# Patient Record
Sex: Male | Born: 1975 | Race: White | Hispanic: No | Marital: Married | Smoking: Current every day smoker
Health system: Southern US, Community
[De-identification: ages and names within clinical notes are randomized; demographics above are authoritative.]

## PROBLEM LIST (undated history)

## (undated) DIAGNOSIS — J45909 Unspecified asthma, uncomplicated: Secondary | ICD-10-CM

## (undated) DIAGNOSIS — Z22322 Carrier or suspected carrier of Methicillin resistant Staphylococcus aureus: Secondary | ICD-10-CM

---

## 2006-10-18 ENCOUNTER — Inpatient Hospital Stay (HOSPITAL_COMMUNITY): Admission: EM | Admit: 2006-10-18 | Discharge: 2006-10-23 | Payer: Self-pay | Admitting: Emergency Medicine

## 2009-09-13 ENCOUNTER — Emergency Department (HOSPITAL_COMMUNITY): Admission: EM | Admit: 2009-09-13 | Discharge: 2009-09-13 | Payer: Self-pay | Admitting: Emergency Medicine

## 2010-07-05 NOTE — Discharge Summary (Signed)
NAMETRINTON, PREWITT                  ACCOUNT NO.:  [id number]   MEDICAL RECORD NO.:  [id number]          PATIENT TYPE:  INP   LOCATION:  2031                         FACILITY:  [facility]   PHYSICIAN:  Mobolaji B. Bakare, M.D.DATE OF BIRTH:  25-Apr-1975   DATE OF ADMISSION:  10/18/2006  DATE OF DISCHARGE:  10/22/2006                               DISCHARGE SUMMARY   PRIMARY CARE PHYSICIAN:  Evelena Peat, M.D. at [facility]   FINAL DIAGNOSES:  1. Left facial cellulitis and methicillin-resistant Staphylococcus      aureus abscess.  2. Asthma exacerbation.  3. Tobacco abuse.  4. Obesity.   CONSULT:  ENT consult provided by Onalee Hua L. Annalee Genta, M.D.   PROCEDURE:  1. Maxillofacial CT shows extensive left-sided cellulitis without      evidence of abscess, periorbital soft tissue swelling without retro-      orbital extension, mild left-sided adenopathy likely reactive  2. Incision and drainage in the operating room done on October 19, 2006.   BRIEF HISTORY:  Please refer to the admission H&P for full details.   HOSPITAL COURSE:  Left facial cellulitis and MRSA abscess.  Mr. Matlock  presented with 4 days history of progressive left facial swelling,  fever, pain.  Clinically he was noted to have left facial cellulitis  with possibility of an abscess.  However CT scan of the maxillofacial  did not confirm an abscess.  The patient was admitted and started on IV  vancomycin for MRSA infection.  On October 19, 2006 it was noted that the  swelling had increased in size with central area of fluctuance.  He was  subsequently taken to the OR for incision and drainage and cultures were  sent.  Cultures taken from the emergency room and intraoperatively  reported MRSA sensitive to Bactrim, doxycycline and vancomycin.  The  patient completed 4-day course of IV vancomycin.  He has been switched  to p.o. doxycycline on discharge which he will complete for 10 days.  It  is also  noted that the patient was on Bactrim for 24 hours prior to  hospitalization.   Asthma exacerbation.  The patient does have history of asthma, but he  has not been compliant with using his nebulizer.  He was in mild  exacerbation during the course of hospitalization.  He was started on  regular nebulization.  He improved significantly and this has been  switched to p.r.n.  From the patient's history it appears that he has  chronic persistent asthma.  He was started on Flovent during the course  of hospitalization, but the patient refused to continue using any form  of inhaler on a regular basis.  However, he is willing to use albuterol  p.r.n.  I advised him to discuss this with his primary care physician.   The patient has also been counseled on tobacco cessation.  He is willing  to quit.   DISCHARGE MEDICATIONS:  Doxycycline 100 mg b.i.d. for 10 days, Percocet  5/32 one to two p.o. q.4h. p.r.n., albuterol inhaler 2 puffs  q.2h.  p.r.n.   Wound care with normal saline and dry gauze two times a day.  Follow-up  with Evelena Peat, M.D./[facility] in a week.      Mobolaji B. Corky Downs, M.D.  Electronically Signed     MBB/MEDQ  D:  10/22/2006  T:  10/22/2006  Job:  [id number]   cc:   Onalee Hua L. Annalee Genta, M.D.  Evelena Peat, M.D.

## 2010-07-05 NOTE — Discharge Summary (Signed)
NAMEIMANOL, BIHL                  ACCOUNT NO.:  [id number]   MEDICAL RECORD NO.:  [id number]          PATIENT TYPE:  INP   LOCATION:  5742                         FACILITY:  [facility]   PHYSICIAN:  Mobolaji B. Bakare, M.D.DATE OF BIRTH:  1975-03-04   DATE OF ADMISSION:  10/18/2006  DATE OF DISCHARGE:  10/23/2006                               DISCHARGE SUMMARY   ADDENDUM:   PRIMARY CARE PHYSICIAN:  Evelena Peat, M.D, [facility].   This is an addendum to earlier discharge summary done by Dr. Corky Downs.  The patient was initially scheduled for discharge on October 22, 2006.  Upon writing the discharge order, a call was received from microbiology  stating that he has gram-positive cocci growing on blood culture done on  October 18, 2006, one out of two sets.  The patient has MRSA abscess from  left facial specimen.  Discharge was therefore delayed to observe for  another 24 hours pending final outcome of the blood culture.  Two more  sets of blood cultures were drawn on October 22, 2006.   The patient has remained afebrile.  He has significant improvement in  the left facial cellulitis.  The final identification of these gram-  positive cocci is not yet available.  I have discussed personally with  microbiology department, and it is of their opinion that this might be a  contaminant but final identification is still pending, and this may be  available in the next 48 hours.  The patient does very well and he got  to go home.  He will be discharged home and follow up with Dr. Caryl Never  in 3 days.   The patient demonstrated some degree of anxiety during this last 24-48  hours.  He does smoke cigarettes and he was on nicotine patch during the  course of hospitalization.  He states that he is sometimes anxious.  I  have prescribed Xanax 0.25 mg  (total of 15 tablets) one to two tablets  q.4-6h. p.r.n. for anxiety, and he would further discuss his symptoms  with Dr.  Caryl Never in the outpatient setting should he require treatment  for generalized anxiety.   RECOMMENDATIONS:  Follow up for report of one set of blood cultures done  on October 18, 2006, and two sets of blood cultures done on October 22, 2006.      Mobolaji B. Corky Downs, M.D.  Electronically Signed     MBB/MEDQ  D:  10/23/2006  T:  10/23/2006  Job:  [id number]   cc:   Evelena Peat, M.D.

## 2010-07-05 NOTE — Op Note (Signed)
Matthew Mendoza, Matthew Mendoza                  ACCOUNT NO.:  [id number]   MEDICAL RECORD NO.:  [id number]          PATIENT TYPE:  INP   LOCATION:  5736                         FACILITY:  [facility]   PHYSICIAN:  Kinnie Scales. Annalee Genta, M.D.DATE OF BIRTH:  08-03-1975   DATE OF PROCEDURE:  10/19/2006  DATE OF DISCHARGE:                               OPERATIVE REPORT   PREOPERATIVE DIAGNOSIS:  Left facial abscess.   POSTOPERATIVE DIAGNOSIS:  Left facial abscess.   INDICATION FOR PROCEDURE:  Left facial abscess.   PROCEDURE:  Incision and drainage, left facial abscess.   SURGEON:  Kinnie Scales. Annalee Genta, M.D.   ANESTHESIA:  General/LMA.   No complications.  Blood loss minimal.  The patient was transferred to  the operating room to the recovery room in stable condition.   BRIEF HISTORY:  Matthew Mendoza is a 35 year old white male with a 5-day  history of progressive swelling and inflammation in the left cheek.  The  patient has had a history of cutaneous skin lesions in the past and felt  that his current infection was likely related to a skin cyst which he  had manipulated.  Over the several days prior to his admission his face  continued to become more erythematous, inflamed and tender and he was  evaluated in the emergency department at [facility] on October 18, 2006.  CT scan at that time showed no evidence of abscess but the  patient had significant soft tissue inflammation and swelling.  He was  admitted to the hospital the [facility] health care team, started on  vancomycin and pain medications.  On the first hospital day he was  reevaluated.  He has had a significant accumulation of fluid and  increasing swelling in the left cheek and given his history, I  recommended incision and drainage of the left cheek under general  anesthesia.  Risks, benefits and possible complications were discussed  with the patient and his wife.  They understood and concurred with our  plan for surgery which is  scheduled on a semi-urgent basis under general  anesthesia at [facility].   SURGICAL PROCEDURE:  The patient was brought to the operating room on  October 19, 2006, and placed in the supine position on the operating  table.  General LMA anesthesia was established without difficulty.  When  the patient was adequately anesthetized, his skin was prepped with  Betadine and he was positioned on the operating table.  The procedure  was begun using a hemostat to spread the tissue at the previous draining  site in the posterior lateral aspect of the left cheek.  The abscess  cavity was followed anteriorly and a significant amount of pus,  approximately 30 mL, was expressed from the wound.  Culture and  sensitivity and Gram stain were obtained.  The wound was flushed with  saline solution and a quarter-inch Penrose drain was placed at the depth  of the incision.  It  was sutured to the skin using a 4-0 Ethilon suture.  The patient's wound  was dressed with 4x4 gauze.  He was awakened from his anesthetic and  transferred from the operating room to the recovery room in stable  condition.  No complications.  Blood loss minimal.           ______________________________  Kinnie Scales. Annalee Genta, M.D.     DLS/MEDQ  D:  16/11/9602  T:  10/20/2006  Job:  [id number]

## 2010-07-05 NOTE — H&P (Signed)
NAMECONSTANTINO, STARACE                  ACCOUNT NO.:  [id number]   MEDICAL RECORD NO.:  [id number]          PATIENT TYPE:  EMS   LOCATION:  MINO                         FACILITY:  [facility]   PHYSICIAN:  Mobolaji B. Bakare, M.D.DATE OF BIRTH:  [birth year]   DATE OF ADMISSION:  10/18/2006  DATE OF DISCHARGE:                              HISTORY & PHYSICAL   CHIEF COMPLAINT:  Painful left facial swelling.   HISTORY OF PRESENTING COMPLAINTS:  Mr. Keng is a very pleasant 35-year-  old Caucasian male who has history of asthmatic bronchitis.  He was in  his usual state of health until four days ago when he noticed a boil on  his left cheek.  He popped it out and expressed some pussy discharge.  Subsequently, he developed excruciating pain with swelling and redness  over the left cheek.  He went to see his primary care doctor yesterday.  He was given a shot of injection (I believe Rocephin) and started on  Septra p.o.  Today, he developed fever, temperature was 100.3.  He has  lost his appetite.  He decided to come to the emergency room as the  swelling was getting bigger.  He underwent maxcillofacial CT scan which  confirmed cellulitis, but no abscess.  He has been seen by Dr.  Hattie Perch from ENT.  The patient has received IV Rocephin here in the  emergency room.  He has been admitted for IV antibiotics.   He has no history of diabetes mellitus or family history of diabetes.  The patient denies chills.   REVIEW OF SYMPTOMS:  He has no toothache, no headaches, no change in his  vision, no nausea or vomiting or diarrhea or abdominal pain.  He denies  dysuria, urgency, increased micturition.  The patient endorses daily   PAST MEDICAL HISTORY:  1. Asthma as a child.  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  3 . 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   PAST SURGICAL HISTORY:  None.   CURRENT MEDICATIONS:  Septra two tablets b.i.d. which was started  yesterday.   ALLERGIES:  No known drug  allergies.   FAMILY HISTORY:  Both parents are alive.  No known medical illness.  Grandmother died from ovarian cancer in her mid 54s.   SOCIAL HISTORY:  The patient is a Tax adviser.  He smokes cigarettes and  has been smoking for about 15 years, one pack per year.  He occasionally  drinks alcohol over the weekend.  He is independent of activities of  daily wheezing, particularly early in the morning with cough.  He  acknowledges that he has not been treated his chronic asthma.   PHYSICAL EXAMINATION:  VITAL SIGNS:  Initially, temperature 98.1, blood  pressure 114/77, pulse 98, respirations 16, O2 saturation 99% on room  air.  GENERAL:  He is awake, alert and oriented to person, place and time.  HEENT:  Normocephalic, atraumatic.  Pupils equal, round and reactive to  light and accommodation.  Extraocular movements intact.  Left facial  swelling, redness and tenderness, it feels like, over the left cheek.  There is no gum swelling.  Oropharynx is normal.  There is tender,  small, cervical lymphadenopathy on the left.  LUNGS:  Bilateral exploratory rhonchi.  CV:  S1, S2 regular.  ABDOMEN:  Nondistended.  Soft, nontender.  Bowel sounds present.  EXTREMITIES:  No pedal edema.  Calf tenderness.  Dorsalis pedis pulses  2+ bilaterally.  CNS:  No focal neurological deficits.  SKIN:  No rash or petechiae.   LABORATORY DATA:  Initially, white cell count 20.6, hemoglobin 16.5,  hematocrit 48.8, platelets 289.  Differentials:  Absolute neutrophil  count is elevated 18.2, absolute monocytes 1.1.  Sodium 134, potassium  4.2, chloride 104, BUN 12, glucose 117, bicarbonate 24, pH 7.41.   CT scan of the face showed extensive left sided cellulitis without  evidence of abscess, __________ swelling with __________  extension,  mild left sided adenopathy is likely reactive.   ASSESSMENT/PLAN:  Mr .Kimberlin is a 35 year old Caucasian male presenting  with left facial swelling, pain and redness compatible  with cellulitis.  He has leukocytosis of 20,000.  Maxillofacial CT scan showed on abscess.  He will be admitted for IV antibiotics.   1. Left facial cellulitis.  Will start vancomycin to be dosed by      pharmacy.  Will give Tylenol p.r.n. for pain and Vicodin p.r.n. for      severe pain.  Blood cultures will be obtained.  Will check      hemoglobin A1C to assess no underlying diabetes mellitus.  2. Asthma.  I think he has chronic persistent asthma from the history.      I will start him on Flovent 88 mcg b.i.d., albuterol inhaler t.i.d.      and p.r.n.  I have encouraged him to quit smoking.  He is      interested in quitting.  3. Tobacco abuse.  Will give nicotine patch and offer tobacco      cessation counseling.  4. Obesity . the patient's weigh is 225 pounds.  Will offer weight      loss counseling.      Mobolaji B. Corky Downs, M.D.  Electronically Signed     MBB/MEDQ  D:  10/18/2006  T:  10/18/2006  Job:  [id number]   cc:   Onalee Hua L. Annalee Genta, M.D.  Evelena Peat, M.D.

## 2010-08-31 ENCOUNTER — Inpatient Hospital Stay (INDEPENDENT_AMBULATORY_CARE_PROVIDER_SITE_OTHER)
Admission: RE | Admit: 2010-08-31 | Discharge: 2010-08-31 | Disposition: A | Discharge status: Home / Self Care | Payer: Self-pay | Source: Ambulatory Visit | Attending: Family Medicine | Admitting: Family Medicine

## 2010-08-31 DIAGNOSIS — L0201 Cutaneous abscess of face: Secondary | ICD-10-CM

## 2010-08-31 DIAGNOSIS — L03211 Cellulitis of face: Secondary | ICD-10-CM

## 2010-09-01 ENCOUNTER — Inpatient Hospital Stay (INDEPENDENT_AMBULATORY_CARE_PROVIDER_SITE_OTHER)
Admission: RE | Admit: 2010-09-01 | Discharge: 2010-09-01 | Disposition: A | Discharge status: Home / Self Care | Payer: Self-pay | Source: Ambulatory Visit | Attending: Family Medicine | Admitting: Family Medicine

## 2010-09-01 DIAGNOSIS — L0201 Cutaneous abscess of face: Secondary | ICD-10-CM

## 2010-09-03 ENCOUNTER — Inpatient Hospital Stay (INDEPENDENT_AMBULATORY_CARE_PROVIDER_SITE_OTHER)
Admission: RE | Admit: 2010-09-03 | Discharge: 2010-09-03 | Disposition: A | Discharge status: Home / Self Care | Payer: Self-pay | Source: Ambulatory Visit | Attending: Family Medicine | Admitting: Family Medicine

## 2010-09-03 DIAGNOSIS — L0201 Cutaneous abscess of face: Secondary | ICD-10-CM

## 2010-09-05 LAB — CULTURE, ROUTINE-ABSCESS

## 2010-12-02 LAB — POCT I-STAT CREATININE: Operator id: 285491

## 2010-12-02 LAB — DIFFERENTIAL
Basophils Absolute: 0
Basophils Absolute: 0.1
Basophils Absolute: 0.1
Basophils Relative: 0
Basophils Relative: 1
Eosinophils Absolute: 0.2
Eosinophils Relative: 0
Lymphocytes Relative: 26
Lymphocytes Relative: 6 — ABNORMAL LOW
Lymphs Abs: 1.3
Monocytes Relative: 5
Monocytes Relative: 7
Neutro Abs: 5.5
Neutrophils Relative %: 61
Neutrophils Relative %: 83 — ABNORMAL HIGH
Neutrophils Relative %: 88 — ABNORMAL HIGH

## 2010-12-02 LAB — ANAEROBIC CULTURE

## 2010-12-02 LAB — CULTURE, BLOOD (ROUTINE X 2)
Culture: NO GROWTH
Culture: NO GROWTH
Culture: NO GROWTH

## 2010-12-02 LAB — I-STAT 8, (EC8 V) (CONVERTED LAB)
Acid-base deficit: 1
BUN: 12
Bicarbonate: 23.5
Chloride: 104
HCT: 53 — ABNORMAL HIGH
Hemoglobin: 18 — ABNORMAL HIGH
Operator id: 285491
Sodium: 134 — ABNORMAL LOW
pCO2, Ven: 37.1 — ABNORMAL LOW

## 2010-12-02 LAB — CBC
HCT: 48.8
MCHC: 33.8
MCHC: 34.4
MCV: 83.1
Platelets: 268
Platelets: 289
RBC: 4.53
RBC: 4.75
RDW: 13.2
RDW: 13.5
WBC: 20.6 — ABNORMAL HIGH

## 2010-12-02 LAB — CULTURE, ROUTINE-ABSCESS

## 2010-12-02 LAB — GRAM STAIN

## 2010-12-02 LAB — CARDIAC PANEL(CRET KIN+CKTOT+MB+TROPI)
Relative Index: INVALID
Troponin I: 0.02

## 2011-04-28 ENCOUNTER — Emergency Department (INDEPENDENT_AMBULATORY_CARE_PROVIDER_SITE_OTHER)
Admission: EM | Admit: 2011-04-28 | Discharge: 2011-04-28 | Disposition: A | Discharge status: Home / Self Care | Payer: Self-pay | Source: Home / Self Care | Attending: Emergency Medicine | Admitting: Emergency Medicine

## 2011-04-28 ENCOUNTER — Encounter (HOSPITAL_COMMUNITY): Payer: Self-pay | Admitting: *Deleted

## 2011-04-28 DIAGNOSIS — A4902 Methicillin resistant Staphylococcus aureus infection, unspecified site: Secondary | ICD-10-CM

## 2011-04-28 HISTORY — DX: Carrier or suspected carrier of methicillin resistant Staphylococcus aureus: Z22.322

## 2011-04-28 MED ORDER — MUPIROCIN 2 % EX OINT: TOPICAL_OINTMENT | Freq: Three times a day (TID) | CUTANEOUS | Status: AC

## 2011-04-28 MED ORDER — DOXYCYCLINE HYCLATE 100 MG PO CAPS: 100.0000 mg | ORAL_CAPSULE | Freq: Two times a day (BID) | ORAL | Status: AC

## 2011-04-28 NOTE — ED Notes (Signed)
Pt  Has  A  History  Of  mrsa     He  Reports  sev  Days  Ago  He  Noticed a  Red  Draining  Lesion on l  Side  Of  Face   He  Had some  Doxy  On hand  And  Took one  Today  -    He  Is  requesting  anti biotic RX

## 2011-04-28 NOTE — Discharge Instructions (Signed)
Return here or follow up with your doctor in 2 days for a wound check. Return sooner if you get worse, have a fever >100.4, or any other concerns. Take the medication as written. Take 1 gram of tylenol with the motrin up to 4 times a day as needed for pain and fever. This is an effective combination for pain. Return if you get worse, have a persistent fever >100.4, or for any concerns.   Go to www.goodrx.com to look up your medications. This will give you a list of where you can find your prescriptions at the most affordable prices.   RESOURCE GUIDE  Insufficient Money for Medicine Contact [employer]:  call "211" or [facility] [phone].  No Primary Care Doctor Call [facility]  [phone] Other agencies that provide inexpensive medical care    [facility]  [phone]    [facility]  [phone]    [facility]  [phone]    [facility]  [phone] [address] [place] [address]    [facility]  [phone]    [facility]  [phone] [facility] [phone]   [address] [place] [address] [place], [place] [address]  [facility] Resources  [facility]     [employer]                          [facility] [address] [place]                       [address]      [address] [place] [address]   [phone] (After Hours)

## 2011-04-28 NOTE — ED Provider Notes (Signed)
History     CSN: [id number]  Arrival date & time 04/28/11  1053   First MD Initiated Contact with Patient 04/28/11 1248      Chief Complaint  Patient presents with  . Recurrent Skin Infections    (Consider location/radiation/quality/duration/timing/severity/associated sxs/prior treatment) HPI Comments: Patient with painful, erythematous area on his left cheek starting several days ago. Patient states it started off as a small "pimple", which has become gradually larger. Reports spontaneous purulent drainage starting yesterday. Has been applying warm compresses, tumeric with some improvement. Started taking a leftover prescription of doxycycline, but is requesting a refill of his doxycycline, as he does not have enough to complete full course. No nausea, vomiting, headaches, fevers. No trauma to the area. Patient has had multiple MRSA infections in this area before. States these usually do not respond to Bactrim.  ROS as noted in HPI. All other ROS negative.   Patient is a 36 y.o. male presenting with rash. The history is provided by the patient. No language interpreter was used.  Rash     Past Medical History  Diagnosis Date  . MRSA carrier     History reviewed. No pertinent past surgical history.  History reviewed. No pertinent family history.  History  Substance Use Topics  . Smoking status: Current Everyday Smoker  . Smokeless tobacco: Not on file  . Alcohol Use: No      Review of Systems  Skin: Positive for rash.    Allergies  Review of patient's allergies indicates no known allergies.  Home Medications   Current Outpatient Rx  Name Route Sig Dispense Refill  . DOXYCYCLINE HYCLATE 100 MG PO CAPS Oral Take 1 capsule (100 mg total) by mouth 2 (two) times daily. 20 capsule 0  . MUPIROCIN 2 % EX OINT Topical Apply topically 3 (three) times daily. Apply after warm soak for 10 minutes 22 g 0    BP 101/52  Pulse 64  Temp(Src) 98.4 F (36.9 C) (Oral)  Resp 16   SpO2 99%  Physical Exam  Nursing note and vitals reviewed. Constitutional: He is oriented to person, place, and time. He appears well-developed and well-nourished.  HENT:  Head: Normocephalic and atraumatic.         2 x 2.5 cm tender erythema, induration on left cheek. Expressible purulent drainage. No other lesions.  Eyes: Conjunctivae and EOM are normal.  Neck: Normal range of motion.  Cardiovascular: Normal rate.   Pulmonary/Chest: Effort normal. No respiratory distress.  Abdominal: He exhibits no distension.  Musculoskeletal: Normal range of motion.  Lymphadenopathy:    He has no cervical adenopathy.  Neurological: He is alert and oriented to person, place, and time.  Skin: Skin is warm and dry. Rash noted.  Psychiatric: He has a normal mood and affect. His behavior is normal.    ED Course  Procedures (including critical care time)  Labs Reviewed - No data to display No results found.   1. 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 Will have him continue warm compresses, start him on doxycycline. Will also send home on Bactroban, for future infections. Patient to followup with his primary care physician or a PMD of his choice when necessary.  Domenick Gong, MD 04/28/11 [phone]

## 2011-06-14 ENCOUNTER — Encounter (HOSPITAL_COMMUNITY): Payer: Self-pay | Admitting: *Deleted

## 2011-06-14 ENCOUNTER — Emergency Department (INDEPENDENT_AMBULATORY_CARE_PROVIDER_SITE_OTHER)
Admission: EM | Admit: 2011-06-14 | Discharge: 2011-06-14 | Disposition: A | Discharge status: Home / Self Care | Payer: Self-pay | Source: Home / Self Care | Attending: Emergency Medicine | Admitting: Emergency Medicine

## 2011-06-14 DIAGNOSIS — L0292 Furuncle, unspecified: Secondary | ICD-10-CM

## 2011-06-14 DIAGNOSIS — F411 Generalized anxiety disorder: Secondary | ICD-10-CM

## 2011-06-14 DIAGNOSIS — F419 Anxiety disorder, unspecified: Secondary | ICD-10-CM

## 2011-06-14 MED ORDER — DOXYCYCLINE HYCLATE 100 MG PO CAPS: 100.0000 mg | ORAL_CAPSULE | Freq: Two times a day (BID) | ORAL | Status: AC

## 2011-06-14 MED ORDER — TRAMADOL HCL 50 MG PO TABS: 50.0000 mg | ORAL_TABLET | Freq: Four times a day (QID) | ORAL | Status: AC | PRN

## 2011-06-14 MED ORDER — BUSPIRONE HCL 10 MG PO TABS: 10.0000 mg | ORAL_TABLET | Freq: Three times a day (TID) | ORAL | Status: AC

## 2011-06-14 NOTE — ED Notes (Signed)
Pt with abcess posterior neck onset x 3 days pt with history abcess MRSA - pt wants culture done - in past had been treatable with doxycycline pt wants to be sure correct antibiotic prescribed

## 2011-06-14 NOTE — ED Notes (Signed)
Pt instructions reviewed x 2 - dr Ladon Applebaum talked with pt second time - now ready for discharge

## 2011-06-14 NOTE — ED Provider Notes (Signed)
History     CSN: [id number]  Arrival date & time 06/14/11  9147   First MD Initiated Contact with Patient 06/14/11 1024      Chief Complaint  Patient presents with  . Abscess    (Consider location/radiation/quality/duration/timing/severity/associated sxs/prior treatment) HPI Comments: I continue to have soft tissue infections frequently. Came in today as an last 3-4 days have had what it feels like a abscess on my neck. Have been using this silver cream and keeping the area close suffered drain on itself, it's very tender and is triggering headaches and soreness all over my neck. Have seen some drainage but still tender. Have been doing some research in breathing and I'm afraid if I continue to take doxycycline could make things more complicated in the future for me as I continue to have this soft tissue infections.  Will like to have this infection culture this time as I can get more information for future antibiotic choices and options. Has several like to reduce the frequency and usage of doxycycline.    Upon discharge was informed by nurse staff the patient had other questions.  1 continue help whenever something for pain as my neck is hurting? 2 could do prescribe me something for my nerves I have used Xanax in the past, this is giving me a lot of anxiety and nerves?  Patient is a 36 y.o. male presenting with abscess. The history is provided by the patient.  Abscess  This is a new problem. The onset was gradual. The problem occurs frequently. The abscess is present on the neck. The abscess is characterized by redness, draining and swelling. Pertinent negatives include no anorexia and no fever.    Past Medical History  Diagnosis Date  . MRSA carrier     History reviewed. No pertinent past surgical history.  History reviewed. No pertinent family history.  History  Substance Use Topics  . Smoking status: Current Everyday Smoker  . Smokeless tobacco: Not on file  .  Alcohol Use: No      Review of Systems  Constitutional: Negative for fever, activity change and appetite change.  Gastrointestinal: Negative for anorexia.  Skin: Positive for rash.  Neurological: Positive for headaches.    Allergies  Review of patient's allergies indicates no known allergies.  Home Medications   Current Outpatient Rx  Name Route Sig Dispense Refill  . BUSPIRONE HCL 10 MG PO TABS Oral Take 1 tablet (10 mg total) by mouth 3 (three) times daily. 30 tablet 0  . DOXYCYCLINE HYCLATE 100 MG PO CAPS Oral Take 1 capsule (100 mg total) by mouth 2 (two) times daily. 20 capsule 0  . TRAMADOL HCL 50 MG PO TABS Oral Take 1 tablet (50 mg total) by mouth every 6 (six) hours as needed for pain. 15 tablet 0    BP 135/81  Pulse 67  Temp(Src) 98.5 F (36.9 C) (Oral)  Resp 18  SpO2 98%  Physical Exam  Nursing note and vitals reviewed. Constitutional: He is oriented to person, place, and time. He appears well-developed and well-nourished.  Neurological: He is alert and oriented to person, place, and time.  Skin: Skin is warm.     Psychiatric: His speech is normal. Judgment and thought content normal. His mood appears anxious. His affect is not angry and not inappropriate. He is is hyperactive. He is not agitated, not aggressive, not slowed, not withdrawn and not combative. Cognition and memory are normal. He does not exhibit a depressed mood. He is  attentive.    ED Course  INCISION AND DRAINAGE Performed by: Falyn Rubel Authorized by: Jimmie Molly Consent: Verbal consent obtained. Patient understanding: patient states understanding of the procedure being performed Patient identity confirmed: verbally with patient Type: abscess Body area: head/neck Location details: neck Local anesthetic: NaHCO3 (sodium bicarbonate) Patient sedated: no Scalpel size: 15 Drainage: purulent Drainage amount: scant Wound treatment: wound left open Patient tolerance: Patient tolerated the  procedure well with no immediate complications.   (including critical care time)   Labs Reviewed  CULTURE, ROUTINE-ABSCESS   No results found.   1. Carbuncle and furuncle   2. Anxiety       MDM  Recurrent soft tissue infections patient with a drainable carbuncle on right side posterior aspect of neck with some mild tenderness drainage sample obtained and cultured. Patient with several concerns and in addition expressing some generalized anxiety symptoms. Upon usual treatment patient will followup with our behavioral clinic for further treatment and to address his anxiety related symptoms. Patient opted and requests to be treated this time the doxycycline but also wanted culture done for future reference. Small carbuncle was incised in puncture obtaining minimal, of purulent exudate  Jimmie Molly, MD 06/14/11 1227

## 2011-06-14 NOTE — Discharge Instructions (Signed)
Post discharge we discuss several things. Upon mutual agreement you will followup with your behavioral clinic.    Abscess An abscess (boil or furuncle) is an infected area that contains a collection of pus.  SYMPTOMS Signs and symptoms of an abscess include pain, tenderness, redness, or hardness. You may feel a moveable soft area under your skin. An abscess can occur anywhere in the body.  TREATMENT  A surgical cut (incision) may be made over your abscess to drain the pus. Gauze may be packed into the space or a drain may be looped through the abscess cavity (pocket). This provides a drain that will allow the cavity to heal from the inside outwards. The abscess may be painful for a few days, but should feel much better if it was drained.  Your abscess, if seen early, may not have localized and may not have been drained. If not, another appointment may be required if it does not get better on its own or with medications. HOME CARE INSTRUCTIONS   Only take over-the-counter or prescription medicines for pain, discomfort, or fever as directed by your caregiver.   Take your antibiotics as directed if they were prescribed. Finish them even if you start to feel better.   Keep the skin and clothes clean around your abscess.   If the abscess was drained, you will need to use gauze dressing to collect any draining pus. Dressings will typically need to be changed 3 or more times a day.   The infection may spread by skin contact with others. Avoid skin contact as much as possible.   Practice good hygiene. This includes regular hand washing, cover any draining skin lesions, and do not share personal care items.   If you participate in sports, do not share athletic equipment, towels, whirlpools, or personal care items. Shower after every practice or tournament.   If a draining area cannot be adequately covered:   Do not participate in sports.   Children should not participate in day care until the  wound has healed or drainage stops.   If your caregiver has given you a follow-up appointment, it is very important to keep that appointment. Not keeping the appointment could result in a much worse infection, chronic or permanent injury, pain, and disability. If there is any problem keeping the appointment, you must call back to this facility for assistance.  SEEK MEDICAL CARE IF:   You develop increased pain, swelling, redness, drainage, or bleeding in the wound site.   You develop signs of generalized infection including muscle aches, chills, fever, or a general ill feeling.   You have an oral temperature above 102 F (38.9 C).  MAKE SURE YOU:   Understand these instructions.   Will watch your condition.   Will get help right away if you are not doing well or get worse.  Document Released: 11/16/2004 Document Revised: 01/26/2011 Document Reviewed: 09/10/2007 [facility] Patient Information 2012 [place], [place].

## 2011-06-18 LAB — CULTURE, ROUTINE-ABSCESS

## 2011-06-19 ENCOUNTER — Telehealth (HOSPITAL_COMMUNITY): Payer: Self-pay | Admitting: *Deleted

## 2011-06-19 NOTE — ED Notes (Signed)
Pt returned call, MRSA instructions provided.  Pt came in for copy of lab result.  Result provided.

## 2011-06-19 NOTE — ED Notes (Signed)
Wound culture positive for MRSA, pt adequately treated at visit with Doxycycline.  Attempted to contact pt, left message.

## 2012-03-18 ENCOUNTER — Emergency Department (HOSPITAL_BASED_OUTPATIENT_CLINIC_OR_DEPARTMENT_OTHER)
Admission: EM | Admit: 2012-03-18 | Discharge: 2012-03-18 | Disposition: A | Discharge status: Home / Self Care | Payer: Self-pay | Attending: Emergency Medicine | Admitting: Emergency Medicine

## 2012-03-18 ENCOUNTER — Encounter (HOSPITAL_BASED_OUTPATIENT_CLINIC_OR_DEPARTMENT_OTHER): Payer: Self-pay | Admitting: *Deleted

## 2012-03-18 DIAGNOSIS — L03211 Cellulitis of face: Secondary | ICD-10-CM | POA: Insufficient documentation

## 2012-03-18 DIAGNOSIS — F17200 Nicotine dependence, unspecified, uncomplicated: Secondary | ICD-10-CM | POA: Insufficient documentation

## 2012-03-18 DIAGNOSIS — Z8614 Personal history of Methicillin resistant Staphylococcus aureus infection: Secondary | ICD-10-CM | POA: Insufficient documentation

## 2012-03-18 DIAGNOSIS — Z79899 Other long term (current) drug therapy: Secondary | ICD-10-CM | POA: Insufficient documentation

## 2012-03-18 DIAGNOSIS — L0201 Cutaneous abscess of face: Secondary | ICD-10-CM | POA: Insufficient documentation

## 2012-03-18 MED ORDER — SULFAMETHOXAZOLE-TRIMETHOPRIM 800-160 MG PO TABS: 1.0000 | ORAL_TABLET | Freq: Two times a day (BID) | ORAL | Status: DC

## 2012-03-18 NOTE — ED Notes (Signed)
Pt c/o abscess to face x 1 day  HX MRSA

## 2012-03-18 NOTE — ED Provider Notes (Signed)
History     CSN: [id number]  Arrival date & time 03/18/12  2018   First MD Initiated Contact with Patient 03/18/12 2214      Chief Complaint  Patient presents with  . Abscess    (Consider location/radiation/quality/duration/timing/severity/associated sxs/prior treatment) Patient is a 37 y.o. male presenting with abscess. The history is provided by the patient.  Abscess  This is a recurrent problem. The current episode started yesterday. The onset was sudden. The problem occurs continuously. The problem has been gradually worsening. The abscess is present on the face. The problem is moderate. The abscess is characterized by itchiness, redness and painfulness. It is unknown what he was exposed to. The abscess first occurred at home. Pertinent negatives include no fever, no vomiting, no sore throat and no cough. Associated symptoms comments: nausea.    Past Medical History  Diagnosis Date  . MRSA carrier     History reviewed. No pertinent past surgical history.  History reviewed. No pertinent family history.  History  Substance Use Topics  . Smoking status: Current Every Day Smoker -- 1.0 packs/day    Types: Cigarettes  . Smokeless tobacco: Not on file  . Alcohol Use: No      Review of Systems  Constitutional: Negative for fever.  HENT: Negative for sore throat.   Respiratory: Negative for cough.   Gastrointestinal: Negative for vomiting.  All other systems reviewed and are negative.    Allergies  Review of patient's allergies indicates no known allergies.  Home Medications   Current Outpatient Rx  Name  Route  Sig  Dispense  Refill  . BUSPIRONE HCL 10 MG PO TABS   Oral   Take 1 tablet (10 mg total) by mouth 3 (three) times daily.   30 tablet   0     BP 134/70  Pulse 79  Temp 98.4 F (36.9 C) (Oral)  Resp 16  Ht 5\' 9"  (1.753 m)  Wt 225 lb (102.059 kg)  BMI 33.23 kg/m2  SpO2 100%  Physical Exam  Constitutional: He is oriented to person, place, and  time. He appears well-developed and well-nourished. No distress.  HENT:  Head: Atraumatic. Microcephalic:  No airway impingement. No neck swelling. Patient's airway is very patent.    Mouth/Throat: No oropharyngeal exudate.       Submental space is soft. Left submandibular cervical lymphadenopathy.  No airway impingement. No neck swelling. Patient's airway is patent.  Eyes: EOM are normal. Pupils are equal, round, and reactive to light.  Neck: Normal range of motion. Neck supple.  Cardiovascular: Normal rate and regular rhythm.  Exam reveals no friction rub.   No murmur heard. Pulmonary/Chest: Effort normal and breath sounds normal. No respiratory distress. He has no wheezes. He has no rales.  Abdominal: He exhibits no distension. There is no tenderness. There is no rebound.  Musculoskeletal: Normal range of motion. He exhibits no edema.  Neurological: He is alert and oriented to person, place, and time.  Skin: He is not diaphoretic.    ED Course  Procedures (including critical care time)  Labs Reviewed - No data to display No results found.   1. Facial cellulitis       MDM   37 year old male with history of recurrent abscesses presents with a chin rash that began yesterday and has gradually worsened. Mild associated nausea. Vitals are stable here. He has left chin cellulitis. No concern for abscess. It is actively oozing and draining. No focal point of fluctuance that needs I&D.  Culture shows susceptibility to Bactrim. Given Bactrim to go home. Submental space is soft and the patient. Airways intact. He has mild reactive lymphadenopathy there. No concern for Ludwig's angina.  Elwin Mocha, MD 03/19/12 [phone]

## 2012-03-19 NOTE — ED Notes (Signed)
Pt called to requests doxycycline instead of bactrim for abscess; states has hx of mrsa and he knows doxycycline will work for him; prescription for doxycycline 100mg  po bid for 10 days, no refills per dr rancour called [phone] at pt request.

## 2012-03-22 NOTE — ED Provider Notes (Signed)
I saw and evaluated the patient, reviewed the resident's note and I agree with the findings and plan.  Matthew Chick, MD 03/22/12 1120

## 2012-05-16 ENCOUNTER — Encounter (HOSPITAL_COMMUNITY): Payer: Self-pay | Admitting: *Deleted

## 2012-05-16 ENCOUNTER — Emergency Department (INDEPENDENT_AMBULATORY_CARE_PROVIDER_SITE_OTHER)
Admission: EM | Admit: 2012-05-16 | Discharge: 2012-05-16 | Disposition: A | Discharge status: Home / Self Care | Payer: Self-pay | Source: Home / Self Care | Attending: Family Medicine | Admitting: Family Medicine

## 2012-05-16 DIAGNOSIS — L03211 Cellulitis of face: Secondary | ICD-10-CM

## 2012-05-16 DIAGNOSIS — L0201 Cutaneous abscess of face: Secondary | ICD-10-CM

## 2012-05-16 MED ORDER — IBUPROFEN 600 MG PO TABS: 600.0000 mg | ORAL_TABLET | Freq: Three times a day (TID) | ORAL | Status: DC | PRN

## 2012-05-16 MED ORDER — CHLORHEXIDINE GLUCONATE 4 % EX SOLN: 1.0000 "application " | CUTANEOUS | Status: DC

## 2012-05-16 MED ORDER — TRAMADOL HCL 50 MG PO TABS: 50.0000 mg | ORAL_TABLET | Freq: Four times a day (QID) | ORAL | Status: DC | PRN

## 2012-05-16 MED ORDER — DOXYCYCLINE HYCLATE 100 MG PO CAPS: 100.0000 mg | ORAL_CAPSULE | Freq: Two times a day (BID) | ORAL | Status: DC

## 2012-05-16 MED ORDER — MUPIROCIN 2 % EX OINT: TOPICAL_OINTMENT | Freq: Three times a day (TID) | CUTANEOUS | Status: DC

## 2012-05-16 NOTE — ED Provider Notes (Signed)
History     CSN: [id number]  Arrival date & time 05/16/12  1013   First MD Initiated Contact with Patient 05/16/12 1042      Chief Complaint  Patient presents with  . 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 Here complaining of an area of tenderness in the right cheek. Symptoms present for 3 days, started as a pustule but he tried to squeeze with no success now are is swollen and tender. Patient reports he has had a skin infections responding well with doxycycline in the past and wants to have this same antibiotic. He wants also a prescription for Bactroban ointment. Denies fever or chills.   Past Medical History  Diagnosis Date  . MRSA carrier     History reviewed. No pertinent past surgical history.  History reviewed. No pertinent family history.  History  Substance Use Topics  . Smoking status: Current Every Day Smoker -- 1.00 packs/day    Types: Cigarettes  . Smokeless tobacco: Not on file  . Alcohol Use: No      Review of Systems  Constitutional: Negative for fever, chills, appetite change and fatigue.  Gastrointestinal: Negative for nausea.  Skin: Positive for rash.       As per history of present illness  Neurological: Positive for headaches.    Allergies  Review of patient's allergies indicates no known allergies.  Home Medications   Current Outpatient Rx  Name  Route  Sig  Dispense  Refill  . busPIRone (BUSPAR) 10 MG tablet   Oral   Take 1 tablet (10 mg total) by mouth 3 (three) times daily.   30 tablet   0   . Chlorhexidine Gluconate 4 % SOLN   Apply externally   Apply 1 application topically 2 (two) times a week.   1 Bottle   0   . doxycycline (VIBRAMYCIN) 100 MG capsule   Oral   Take 1 capsule (100 mg total) by mouth 2 (two) times daily.   20 capsule   0   . ibuprofen  (ADVIL,MOTRIN) 600 MG tablet   Oral   Take 1 tablet (600 mg total) by mouth every 8 (eight) hours as needed for pain.   20 tablet   0   . mupirocin ointment (BACTROBAN) 2 %   Topical   Apply topically 3 (three) times daily.   22 g   0   . traMADol (ULTRAM) 50 MG tablet   Oral   Take 1 tablet (50 mg total) by mouth every 6 (six) hours as needed for pain.   15 tablet   0     BP 116/71  Pulse 62  Temp(Src) 98.1 F (36.7 C) (Oral)  Resp 16  SpO2 100%  Physical Exam  Nursing note and vitals reviewed. Constitutional: He is oriented to person, place, and time. He appears well-developed and well-nourished. No distress.  HENT:  Head: Normocephalic and atraumatic.  Mouth/Throat: Oropharynx is clear and moist.  Eyes: Conjunctivae and EOM are normal. Right eye exhibits no discharge. Left eye exhibits no discharge.  Neck: Neck supple.  Cardiovascular: Normal heart sounds.   Pulmonary/Chest: Breath sounds normal.  Lymphadenopathy:    He has no cervical adenopathy.  Neurological: He is alert and oriented to person, place, and time.  Skin: He is not diaphoretic.  There is a 2 x 2 centimeter area of mild erythema and induration with a central  excoriation in the right lower cheek close to the right perioral crease. no fluctuation. No spontaneous drainage. No striking erythema. There is a well-healed acne marks and other areas of the face.    ED Course  Procedures (including critical care time)  Labs Reviewed - No data to display No results found.   1. Cellulitis and abscess of face       MDM  No areas of fluctuation. Impress small cellulitis of the face. Treated with doxycycline and mupirocin. Also given a prescription for ibuprofen and tramadol. Also provided information and a prescription for chlorhexidine to use from neck down twice a week. Supportive care and red flags should prompt his return to medical attention discussed with patient and provided in writing.  Specifically patient was asked to go to the emergency department if worsening redness swelling or pain in his face despite following treatment.        Sharin Grave, MD 05/16/12 1501

## 2012-05-16 NOTE — ED Notes (Signed)
Pt reports swelling to right side of face with multiple hx of MRSA infections (to be treated with doxy)

## 2012-07-16 ENCOUNTER — Encounter (HOSPITAL_COMMUNITY): Payer: Self-pay | Admitting: Emergency Medicine

## 2012-07-16 ENCOUNTER — Emergency Department (INDEPENDENT_AMBULATORY_CARE_PROVIDER_SITE_OTHER)
Admission: EM | Admit: 2012-07-16 | Discharge: 2012-07-16 | Disposition: A | Discharge status: Home / Self Care | Payer: Self-pay | Source: Home / Self Care | Attending: Emergency Medicine | Admitting: Emergency Medicine

## 2012-07-16 DIAGNOSIS — L089 Local infection of the skin and subcutaneous tissue, unspecified: Secondary | ICD-10-CM

## 2012-07-16 HISTORY — DX: Unspecified asthma, uncomplicated: J45.909

## 2012-07-16 MED ORDER — CEFTRIAXONE SODIUM 1 G IJ SOLR
1.0000 g | Freq: Once | INTRAMUSCULAR | Status: AC
  Administered 2012-07-16: 1 g via INTRAMUSCULAR

## 2012-07-16 MED ORDER — DOXYCYCLINE HYCLATE 100 MG PO TABS
ORAL_TABLET | ORAL | Status: AC
  Filled 2012-07-16: qty 1

## 2012-07-16 MED ORDER — DOXYCYCLINE HYCLATE 100 MG PO CAPS: 100.0000 mg | ORAL_CAPSULE | Freq: Two times a day (BID) | ORAL | Status: DC

## 2012-07-16 MED ORDER — OXYCODONE-ACETAMINOPHEN 5-325 MG PO TABS: 2.0000 | ORAL_TABLET | ORAL | Status: DC | PRN

## 2012-07-16 MED ORDER — HYDROCODONE-ACETAMINOPHEN 5-325 MG PO TABS: 2.0000 | ORAL_TABLET | ORAL | Status: DC | PRN

## 2012-07-16 MED ORDER — CEFTRIAXONE SODIUM 1 G IJ SOLR
INTRAMUSCULAR | Status: AC
  Filled 2012-07-16: qty 10

## 2012-07-16 MED ORDER — LIDOCAINE HCL (PF) 1 % IJ SOLN
INTRAMUSCULAR | Status: AC
  Filled 2012-07-16: qty 5

## 2012-07-16 MED ORDER — DOXYCYCLINE HYCLATE 100 MG PO TABS
100.0000 mg | ORAL_TABLET | Freq: Two times a day (BID) | ORAL | Status: DC
  Administered 2012-07-16: 100 mg via ORAL

## 2012-07-16 NOTE — ED Notes (Signed)
Pt c/o infection/?insect bite to right forearm. Noticed about 3 to 4 days ago.  Pt describes the pain as throbbing. Area is red with streaking up forearm. Pt denies drainage and fever. Pt states hx of mrsa infections.

## 2012-07-16 NOTE — ED Provider Notes (Signed)
History     CSN: [id number]  Arrival date & time 07/16/12  1717   First MD Initiated Contact with Patient 07/16/12 1858      Chief Complaint  Patient presents with  . Cellulitis    right forearm x 3 to 4 days. throbbing sensation throught out arm. red streaking. hx mrsa infections.     (Consider location/radiation/quality/duration/timing/severity/associated sxs/prior treatment) Patient is a 37 y.o. male presenting with rash. The history is provided by the patient. No language interpreter was used.  Rash Pain location: left arm. Pain quality: throbbing   Pain severity:  Moderate  Pt complains of redness and swelling to right arm.   Pt has a history of mrsa Past Medical History  Diagnosis Date  . MRSA carrier   . Asthma     History reviewed. No pertinent past surgical history.  History reviewed. No pertinent family history.  History  Substance Use Topics  . Smoking status: Current Every Day Smoker -- 1.00 packs/day    Types: Cigarettes  . Smokeless tobacco: Not on file  . Alcohol Use: No      Review of Systems  Skin: Positive for rash.  All other systems reviewed and are negative.    Allergies  Review of patient's allergies indicates no known allergies.  Home Medications   Current Outpatient Rx  Name  Route  Sig  Dispense  Refill  . Chlorhexidine Gluconate 4 % SOLN   Apply externally   Apply 1 application topically 2 (two) times a week.   1 Bottle   0   . doxycycline (VIBRAMYCIN) 100 MG capsule   Oral   Take 1 capsule (100 mg total) by mouth 2 (two) times daily.   20 capsule   0   . doxycycline (VIBRAMYCIN) 100 MG capsule   Oral   Take 1 capsule (100 mg total) by mouth 2 (two) times daily.   20 capsule   0   . HYDROcodone-acetaminophen (NORCO/VICODIN) 5-325 MG per tablet   Oral   Take 2 tablets by mouth every 4 (four) hours as needed for pain.   10 tablet   0   . ibuprofen (ADVIL,MOTRIN) 600 MG tablet   Oral   Take 1 tablet (600 mg  total) by mouth every 8 (eight) hours as needed for pain.   20 tablet   0   . mupirocin ointment (BACTROBAN) 2 %   Topical   Apply topically 3 (three) times daily.   22 g   0   . traMADol (ULTRAM) 50 MG tablet   Oral   Take 1 tablet (50 mg total) by mouth every 6 (six) hours as needed for pain.   15 tablet   0     BP 117/70  Pulse 60  Temp(Src) 98.2 F (36.8 C) (Oral)  Resp 16  SpO2 100%  Physical Exam  Constitutional: He is oriented to person, place, and time. He appears well-developed and well-nourished.  Musculoskeletal: He exhibits tenderness.  Swollen pustule right arm,  Streaking x5 inches up arm  Neurological: He is alert and oriented to person, place, and time. He has normal reflexes.  Skin: There is erythema.  Psychiatric: He has a normal mood and affect.    ED Course  Procedures (including critical care time)  Labs Reviewed - No data to display No results found.   1. Skin infection       MDM  Rocephin 1 gram Im.   Pt given doxy here and rx for doxy.  I advised soak area 20 minutes 4 times a day.        Lonia Skinner Vineyard, PA-C 07/16/12 1919

## 2012-07-16 NOTE — ED Provider Notes (Signed)
Medical screening examination/treatment/procedure(s) were performed by non-physician practitioner and as supervising physician I was immediately available for consultation/collaboration.  Raynald Blend, MD 07/16/12 1940

## 2013-03-03 ENCOUNTER — Ambulatory Visit (INDEPENDENT_AMBULATORY_CARE_PROVIDER_SITE_OTHER): Payer: BC Managed Care – PPO | Admitting: Family Medicine

## 2013-03-03 ENCOUNTER — Encounter: Payer: Self-pay | Admitting: Family Medicine

## 2013-03-03 VITALS — BP 126/64 | HR 88 | Temp 99.2°F | Ht 69.0 in | Wt 232.0 lb

## 2013-03-03 DIAGNOSIS — L03211 Cellulitis of face: Secondary | ICD-10-CM

## 2013-03-03 DIAGNOSIS — L0201 Cutaneous abscess of face: Secondary | ICD-10-CM

## 2013-03-03 MED ORDER — DOXYCYCLINE HYCLATE 100 MG PO CAPS: 100.0000 mg | ORAL_CAPSULE | Freq: Two times a day (BID) | ORAL | Status: DC

## 2013-03-03 NOTE — Progress Notes (Signed)
Pre visit review using our clinic review tool, if applicable. No additional management support is needed unless otherwise documented below in the visit note. 

## 2013-03-03 NOTE — Progress Notes (Signed)
   Subjective:    Patient ID: Matthew Mendoza, male    DOB: Jan 10, 1976, 38 y.o.   MRN: [id number]  HPI Here to establish care. Patient here with acute issue of 2 day history of left facial swelling and erythema and tenderness involving patient's cheek region. He noted small pustule and use some heat and expressed some drainage from this region. He has known history of MRSA and has had multiple infections in the past. Had some leftover doxycycline which she started last night. He has not any fever or chills.  He denies any other chronic medical problems. No known drug allergies.  Past Medical History  Diagnosis Date  . MRSA carrier   . Asthma    History reviewed. No pertinent past surgical history.  reports that he has quit smoking. His smoking use included Cigarettes. He smoked 1.00 pack per day. He does not have any smokeless tobacco history on file. He reports that he does not drink alcohol or use illicit drugs. family history includes Depression in his mother. No Known Allergies    Review of Systems  Constitutional: Negative for fever and chills.       Objective:   Physical Exam  Constitutional: He appears well-developed and well-nourished.  Cardiovascular: Normal rate.   Pulmonary/Chest: Effort normal and breath sounds normal. No respiratory distress. He has no wheezes. He has no rales.  Skin:  Patient has some erythema and mild swelling left upper cheek region. Has area about 2 x 3 cm. Mild induration. No fluctuance. No pustules          Assessment & Plan:  Cellulitis left face. History of recurrent MRSA. Cover with doxycycline 100 mg twice a day for 10 days. Warm compresses. Followup promptly for any worsening symptoms

## 2013-03-03 NOTE — Patient Instructions (Signed)
Continue with warm compresses Let us know if edema and redness not improving over the next few days.

## 2013-03-19 ENCOUNTER — Telehealth: Payer: Self-pay | Admitting: Family Medicine

## 2013-03-19 NOTE — Telephone Encounter (Signed)
Pt stated md said he will see any member of family. Can I sch any member ?

## 2013-03-19 NOTE — Telephone Encounter (Signed)
Yes-if they are immediate family members.

## 2013-03-20 NOTE — Telephone Encounter (Signed)
Pt wife has been sch °

## 2013-10-21 ENCOUNTER — Other Ambulatory Visit: Payer: Self-pay | Admitting: Family Medicine

## 2013-10-21 ENCOUNTER — Telehealth: Payer: Self-pay | Admitting: Family Medicine

## 2013-10-21 MED ORDER — DOXYCYCLINE HYCLATE 100 MG PO CAPS: 100.0000 mg | ORAL_CAPSULE | Freq: Two times a day (BID) | ORAL | Status: DC

## 2013-10-21 NOTE — Telephone Encounter (Signed)
Rx sent to pharmacy. Spoke with patient.

## 2013-10-21 NOTE — Telephone Encounter (Signed)
Pt request refill doxycycline (VIBRAMYCIN) 100 MG capsule Would like a cb to discuss the reason he needs. [facility]

## 2016-01-17 ENCOUNTER — Encounter: Payer: Self-pay | Admitting: Family Medicine

## 2016-01-17 ENCOUNTER — Ambulatory Visit (INDEPENDENT_AMBULATORY_CARE_PROVIDER_SITE_OTHER): Payer: Managed Care, Other (non HMO) | Admitting: Family Medicine

## 2016-01-17 VITALS — BP 110/90 | HR 104 | Temp 97.8°F | Ht 69.0 in | Wt 234.0 lb

## 2016-01-17 DIAGNOSIS — L0201 Cutaneous abscess of face: Secondary | ICD-10-CM

## 2016-01-17 MED ORDER — DOXYCYCLINE HYCLATE 100 MG PO CAPS: 100.0000 mg | ORAL_CAPSULE | Freq: Two times a day (BID) | ORAL | 1 refills | Status: DC

## 2016-01-17 NOTE — Progress Notes (Signed)
Pre visit review using our clinic review tool, if applicable. No additional management support is needed unless otherwise documented below in the visit note. 

## 2016-01-17 NOTE — Progress Notes (Signed)
Subjective:     Patient ID: Matthew Mendoza, male   DOB: September 05, 1975, 40 y.o.   MRN: [id number]  HPI Patient has past history of recurrent MRSA. He presents with 2 day history of red raised painful area on his chin. He has not seen any drainage. No fevers or chills. Has used some warm compresses. Has responded well to doxycycline in the past.  No vesicles.    Past Medical History:  Diagnosis Date  . Asthma   . MRSA carrier    No past surgical history on file.  reports that he has quit smoking. His smoking use included Cigarettes. He smoked 1.00 pack per day. He does not have any smokeless tobacco history on file. He reports that he does not drink alcohol or use drugs. family history includes Depression in his mother. No Known Allergies   Review of Systems  Constitutional: Negative for chills and fever.       Objective:   Physical Exam  Constitutional: He appears well-developed and well-nourished.  Neck: Neck supple.  Cardiovascular: Normal rate and regular rhythm.   Skin:  Patient has area of induration erythema warmth and tenderness along the lower chin region just slightly to left of midline. No pustules. Nonfluctuant.       Assessment:     Probable early abscess left chin. Past history of MRSA. Patient is nontoxic. No fluctuance No indication for I and D at this time.    Plan:     -Continue warm compresses several times daily -Start doxycycline 100 mg twice daily for 10 days -Follow-up immediately for any fever, increased redness, or fluctuance  Kristian CoveyBruce W Imir Brumbach MD [facility]

## 2016-01-17 NOTE — Patient Instructions (Addendum)
Use warm compresses several times daily.  Start the antibiotic and follow up for any fever or increased redness or fluctuance.

## 2016-08-25 ENCOUNTER — Other Ambulatory Visit: Payer: Self-pay | Admitting: Family Medicine

## 2016-08-25 NOTE — Telephone Encounter (Signed)
Refill OK

## 2018-03-20 ENCOUNTER — Other Ambulatory Visit: Payer: Self-pay | Admitting: Family Medicine

## 2018-03-21 ENCOUNTER — Encounter: Payer: Self-pay | Admitting: Internal Medicine

## 2018-03-21 ENCOUNTER — Ambulatory Visit (INDEPENDENT_AMBULATORY_CARE_PROVIDER_SITE_OTHER): Payer: Self-pay | Admitting: Internal Medicine

## 2018-03-21 ENCOUNTER — Other Ambulatory Visit: Payer: Self-pay | Admitting: Family Medicine

## 2018-03-21 VITALS — BP 110/70 | HR 71 | Temp 98.3°F | Wt 220.6 lb

## 2018-03-21 DIAGNOSIS — L02811 Cutaneous abscess of head [any part, except face]: Secondary | ICD-10-CM

## 2018-03-21 MED ORDER — DOXYCYCLINE HYCLATE 100 MG PO CAPS: 100.0000 mg | ORAL_CAPSULE | Freq: Two times a day (BID) | ORAL | 0 refills | Status: AC

## 2018-03-21 NOTE — Progress Notes (Signed)
     Established Patient Office Visit     CC/Reason for Visit: scalp abscess  HPI: Matthew Mendoza is a 43 y.o. male who is coming in today for the above mentioned reasons. Past Medical History is significant for: recurrent MRSA skin abscess. This particular one has been recurrent since September. Dr. Caryl Never did not have any openings then so he went to an urgent care and was given Septra which did not resolve the abscess. He tells me that prior susceptibility testing showed sensitivity for doxycycline and vancomycin only, so he knew the septra would not work. He has now had 2 additional small spots pop up on his posterior scalp. He now has some hair loss because of it. He would like Rx for doxy. No fevers.   Past Medical/Surgical History: Past Medical History:  Diagnosis Date  . Asthma   . MRSA carrier     No past surgical history on file.  Social History:  reports that he has been smoking cigarettes. He has been smoking about 1.00 pack per day. He has never used smokeless tobacco. He reports that he does not drink alcohol or use drugs.  Allergies: No Known Allergies  Family History:  Family History  Problem Relation Age of Onset  . Depression Mother      Current Outpatient Medications:  .  doxycycline (VIBRAMYCIN) 100 MG capsule, Take 1 capsule (100 mg total) by mouth 2 (two) times daily for 10 days., Disp: 20 capsule, Rfl: 0  Review of Systems:  Constitutional: Denies fever, chills, diaphoresis, appetite change and fatigue.   Respiratory: Denies SOB, DOE, cough, chest tightness,  and wheezing.   Cardiovascular: Denies chest pain, palpitations and leg swelling.  Gastrointestinal: Denies nausea, vomiting, abdominal pain, diarrhea, constipation, blood in stool and abdominal distention.    Physical Exam: Vitals:   03/21/18 1138  BP: 110/70  Pulse: 71  Temp: 98.3 F (36.8 C)  TempSrc: Oral  SpO2: 97%  Weight: 220 lb 9.6 oz (100.1 kg)    Body mass index is 32.58  kg/m.   Constitutional: NAD, calm, comfortable Eyes: PERRL, lids and conjunctivae normal ENMT: Mucous membranes are moist.  Neck: normal, supple, no masses, no thyromegaly Respiratory: clear to auscultation bilaterally, no wheezing, no crackles. Normal respiratory effort. No accessory muscle use.  Cardiovascular: Regular rate and rhythm, no murmurs / rubs / gallops. No extremity edema. 2+ pedal pulses. No carotid bruits.  Skin: 3 small, inflamed lumps post aspect of head right at the hairline.  Psychiatric: Normal judgment and insight. Alert and oriented x 3. Normal mood.    Impression and Plan:  Cutaneous abscess of head excluding face -H/o recurrent MRSA skin abscesses. -See no need for I and D at this time. -Will Rx a 10 day course of doxycycline. -RTC if no improvement in that timeframe.   Patient Instructions  -Nice meeting you!  -Doxycycline 100 mg twice daily for 10 days for your scalp abscess. Come back to see [place] if no improvement in that time frame. Continue use of warm compresses.      Chaya Jan, MD [facility]

## 2018-03-21 NOTE — Patient Instructions (Signed)
-  Nice meeting you!  -Doxycycline 100 mg twice daily for 10 days for your scalp abscess. Come back to see [place] if no improvement in that time frame. Continue use of warm compresses.

## 2018-05-01 ENCOUNTER — Emergency Department (HOSPITAL_COMMUNITY): Payer: Medicaid Other | Admitting: Anesthesiology

## 2018-05-01 ENCOUNTER — Ambulatory Visit: Admit: 2018-05-01 | Payer: Self-pay | Admitting: Orthopedic Surgery

## 2018-05-01 ENCOUNTER — Encounter (HOSPITAL_COMMUNITY)
Admission: EM | Disposition: A | Discharge status: Home / Self Care | Payer: Self-pay | Source: Home / Self Care | Attending: Emergency Medicine

## 2018-05-01 ENCOUNTER — Encounter (HOSPITAL_COMMUNITY): Payer: Self-pay

## 2018-05-01 ENCOUNTER — Other Ambulatory Visit: Payer: Self-pay

## 2018-05-01 ENCOUNTER — Emergency Department (HOSPITAL_COMMUNITY)
Admission: EM | Admit: 2018-05-01 | Discharge: 2018-05-01 | Disposition: A | Discharge status: Home / Self Care | Payer: Medicaid Other | Attending: Orthopedic Surgery | Admitting: Orthopedic Surgery

## 2018-05-01 ENCOUNTER — Emergency Department (HOSPITAL_COMMUNITY): Payer: Medicaid Other

## 2018-05-01 DIAGNOSIS — Z23 Encounter for immunization: Secondary | ICD-10-CM | POA: Diagnosis not present

## 2018-05-01 DIAGNOSIS — J45909 Unspecified asthma, uncomplicated: Secondary | ICD-10-CM | POA: Diagnosis not present

## 2018-05-01 DIAGNOSIS — S62631B Displaced fracture of distal phalanx of left index finger, initial encounter for open fracture: Secondary | ICD-10-CM | POA: Insufficient documentation

## 2018-05-01 DIAGNOSIS — F17210 Nicotine dependence, cigarettes, uncomplicated: Secondary | ICD-10-CM | POA: Diagnosis not present

## 2018-05-01 DIAGNOSIS — W312XXA Contact with powered woodworking and forming machines, initial encounter: Secondary | ICD-10-CM | POA: Diagnosis not present

## 2018-05-01 DIAGNOSIS — S62661B Nondisplaced fracture of distal phalanx of left index finger, initial encounter for open fracture: Secondary | ICD-10-CM

## 2018-05-01 HISTORY — PX: I&D EXTREMITY: SHX5045

## 2018-05-01 SURGERY — IRRIGATION AND DEBRIDEMENT EXTREMITY
Anesthesia: General | Laterality: Left

## 2018-05-01 MED ORDER — DEXAMETHASONE SODIUM PHOSPHATE 10 MG/ML IJ SOLN
INTRAMUSCULAR | Status: DC | PRN
  Administered 2018-05-01: 10 mg via INTRAVENOUS

## 2018-05-01 MED ORDER — TETANUS-DIPHTH-ACELL PERTUSSIS 5-2.5-18.5 LF-MCG/0.5 IM SUSP
0.5000 mL | Freq: Once | INTRAMUSCULAR | Status: AC
  Administered 2018-05-01: 0.5 mL via INTRAMUSCULAR
  Filled 2018-05-01: qty 0.5

## 2018-05-01 MED ORDER — LIDOCAINE HCL (PF) 1 % IJ SOLN
INTRAMUSCULAR | Status: AC
  Filled 2018-05-01: qty 30

## 2018-05-01 MED ORDER — FENTANYL CITRATE (PF) 250 MCG/5ML IJ SOLN
INTRAMUSCULAR | Status: AC
  Filled 2018-05-01: qty 5

## 2018-05-01 MED ORDER — OXYCODONE HCL 5 MG PO TABS: 5.0000 mg | ORAL_TABLET | Freq: Once | ORAL | Status: DC | PRN

## 2018-05-01 MED ORDER — OXYCODONE-ACETAMINOPHEN 5-325 MG PO TABS: 1.0000 | ORAL_TABLET | ORAL | 0 refills | Status: AC | PRN

## 2018-05-01 MED ORDER — BUPIVACAINE HCL (PF) 0.25 % IJ SOLN
INTRAMUSCULAR | Status: DC | PRN
  Administered 2018-05-01: 30 mL

## 2018-05-01 MED ORDER — LIDOCAINE HCL 2 % IJ SOLN
20.0000 mL | Freq: Once | INTRAMUSCULAR | Status: AC
  Administered 2018-05-01: 400 mg
  Filled 2018-05-01: qty 20

## 2018-05-01 MED ORDER — LACTATED RINGERS IV SOLN
INTRAVENOUS | Status: DC
  Administered 2018-05-01 (×2): via INTRAVENOUS

## 2018-05-01 MED ORDER — CEFAZOLIN SODIUM-DEXTROSE 2-4 GM/100ML-% IV SOLN
2.0000 g | Freq: Once | INTRAVENOUS | Status: AC
  Administered 2018-05-01: 2 g via INTRAVENOUS
  Filled 2018-05-01: qty 100

## 2018-05-01 MED ORDER — ONDANSETRON HCL 4 MG/2ML IJ SOLN: 4.0000 mg | Freq: Four times a day (QID) | INTRAMUSCULAR | Status: DC | PRN

## 2018-05-01 MED ORDER — MIDAZOLAM HCL 5 MG/5ML IJ SOLN
INTRAMUSCULAR | Status: DC | PRN
  Administered 2018-05-01: 2 mg via INTRAVENOUS

## 2018-05-01 MED ORDER — ONDANSETRON HCL 4 MG/2ML IJ SOLN
INTRAMUSCULAR | Status: DC | PRN
  Administered 2018-05-01: 4 mg via INTRAVENOUS

## 2018-05-01 MED ORDER — FENTANYL CITRATE (PF) 100 MCG/2ML IJ SOLN: 25.0000 ug | INTRAMUSCULAR | Status: DC | PRN

## 2018-05-01 MED ORDER — BUPIVACAINE HCL (PF) 0.25 % IJ SOLN
INTRAMUSCULAR | Status: AC
  Filled 2018-05-01: qty 30

## 2018-05-01 MED ORDER — CHLORHEXIDINE GLUCONATE 4 % EX LIQD
60.0000 mL | Freq: Once | CUTANEOUS | Status: DC
  Filled 2018-05-01: qty 60

## 2018-05-01 MED ORDER — PROPOFOL 10 MG/ML IV BOLUS
INTRAVENOUS | Status: AC
  Filled 2018-05-01: qty 40

## 2018-05-01 MED ORDER — FENTANYL CITRATE (PF) 100 MCG/2ML IJ SOLN
INTRAMUSCULAR | Status: DC | PRN
  Administered 2018-05-01 (×2): 50 ug via INTRAVENOUS

## 2018-05-01 MED ORDER — CEPHALEXIN 500 MG PO CAPS: 500.0000 mg | ORAL_CAPSULE | Freq: Four times a day (QID) | ORAL | 0 refills | Status: AC

## 2018-05-01 MED ORDER — OXYCODONE HCL 5 MG/5ML PO SOLN: 5.0000 mg | Freq: Once | ORAL | Status: DC | PRN

## 2018-05-01 MED ORDER — LIDOCAINE 2% (20 MG/ML) 5 ML SYRINGE
INTRAMUSCULAR | Status: DC | PRN
  Administered 2018-05-01: 30 mg via INTRAVENOUS

## 2018-05-01 MED ORDER — PROPOFOL 10 MG/ML IV BOLUS
INTRAVENOUS | Status: DC | PRN
  Administered 2018-05-01: 200 mg via INTRAVENOUS

## 2018-05-01 MED ORDER — MIDAZOLAM HCL 2 MG/2ML IJ SOLN
INTRAMUSCULAR | Status: AC
  Filled 2018-05-01: qty 2

## 2018-05-01 MED ORDER — POVIDONE-IODINE 10 % EX SWAB: 2.0000 "application " | Freq: Once | CUTANEOUS | Status: DC

## 2018-05-01 MED ORDER — 0.9 % SODIUM CHLORIDE (POUR BTL) OPTIME
TOPICAL | Status: DC | PRN
  Administered 2018-05-01: 1000 mL

## 2018-05-01 SURGICAL SUPPLY — 64 items
BANDAGE ACE 3X5.8 VEL STRL LF (GAUZE/BANDAGES/DRESSINGS) ×3 IMPLANT
BANDAGE ACE 4X5 VEL STRL LF (GAUZE/BANDAGES/DRESSINGS) ×3 IMPLANT
BNDG CMPR 9X4 STRL LF SNTH (GAUZE/BANDAGES/DRESSINGS) ×1
BNDG CMPR MD 5X2 ELC HKLP STRL (GAUZE/BANDAGES/DRESSINGS) ×1
BNDG COHESIVE 1X5 TAN STRL LF (GAUZE/BANDAGES/DRESSINGS) ×2 IMPLANT
BNDG CONFORM 2 STRL LF (GAUZE/BANDAGES/DRESSINGS) ×2 IMPLANT
BNDG ELASTIC 2 VLCR STRL LF (GAUZE/BANDAGES/DRESSINGS) ×2 IMPLANT
BNDG ESMARK 4X9 LF (GAUZE/BANDAGES/DRESSINGS) ×3 IMPLANT
BNDG GAUZE ELAST 4 BULKY (GAUZE/BANDAGES/DRESSINGS) ×3 IMPLANT
CORDS BIPOLAR (ELECTRODE) ×3 IMPLANT
COVER SURGICAL LIGHT HANDLE (MISCELLANEOUS) ×3 IMPLANT
COVER WAND RF STERILE (DRAPES) ×3 IMPLANT
CUFF TOURN SGL QUICK 24 (TOURNIQUET CUFF) ×3
CUFF TOURNIQUET SINGLE 18IN (TOURNIQUET CUFF) ×3 IMPLANT
CUFF TOURNIQUET SINGLE 24IN (TOURNIQUET CUFF) IMPLANT
CUFF TRNQT CYL 24X4X16.5-23 (TOURNIQUET CUFF) IMPLANT
DRAIN PENROSE 1/4X12 LTX STRL (WOUND CARE) IMPLANT
DRAPE SURG 17X23 STRL (DRAPES) ×3 IMPLANT
DRSG ADAPTIC 3X8 NADH LF (GAUZE/BANDAGES/DRESSINGS) ×3 IMPLANT
DRSG EMULSION OIL 3X3 NADH (GAUZE/BANDAGES/DRESSINGS) ×2 IMPLANT
ELECT REM PT RETURN 9FT ADLT (ELECTROSURGICAL)
ELECTRODE REM PT RTRN 9FT ADLT (ELECTROSURGICAL) IMPLANT
GAUZE SPONGE 2X2 8PLY STRL LF (GAUZE/BANDAGES/DRESSINGS) IMPLANT
GAUZE SPONGE 4X4 12PLY STRL (GAUZE/BANDAGES/DRESSINGS) ×3 IMPLANT
GAUZE XEROFORM 1X8 LF (GAUZE/BANDAGES/DRESSINGS) ×3 IMPLANT
GAUZE XEROFORM 5X9 LF (GAUZE/BANDAGES/DRESSINGS) IMPLANT
GLOVE BIOGEL PI IND STRL 8.5 (GLOVE) ×1 IMPLANT
GLOVE BIOGEL PI INDICATOR 8.5 (GLOVE) ×2
GLOVE SURG ORTHO 8.0 STRL STRW (GLOVE) ×3 IMPLANT
GOWN STRL REUS W/ TWL LRG LVL3 (GOWN DISPOSABLE) ×3 IMPLANT
GOWN STRL REUS W/ TWL XL LVL3 (GOWN DISPOSABLE) ×1 IMPLANT
GOWN STRL REUS W/TWL LRG LVL3 (GOWN DISPOSABLE) ×9
GOWN STRL REUS W/TWL XL LVL3 (GOWN DISPOSABLE) ×3
HANDPIECE INTERPULSE COAX TIP (DISPOSABLE)
KIT BASIN OR (CUSTOM PROCEDURE TRAY) ×3 IMPLANT
KIT TURNOVER KIT B (KITS) ×3 IMPLANT
MANIFOLD NEPTUNE II (INSTRUMENTS) ×3 IMPLANT
NDL HYPO 25GX1X1/2 BEV (NEEDLE) IMPLANT
NEEDLE HYPO 25GX1X1/2 BEV (NEEDLE) IMPLANT
NS IRRIG 1000ML POUR BTL (IV SOLUTION) ×3 IMPLANT
PACK ORTHO EXTREMITY (CUSTOM PROCEDURE TRAY) ×3 IMPLANT
PAD ARMBOARD 7.5X6 YLW CONV (MISCELLANEOUS) ×6 IMPLANT
PAD CAST 4YDX4 CTTN HI CHSV (CAST SUPPLIES) ×1 IMPLANT
PADDING CAST COTTON 4X4 STRL (CAST SUPPLIES) ×3
SET CYSTO W/LG BORE CLAMP LF (SET/KITS/TRAYS/PACK) IMPLANT
SET HNDPC FAN SPRY TIP SCT (DISPOSABLE) IMPLANT
SOAP 2 % CHG 4 OZ (WOUND CARE) ×3 IMPLANT
SPONGE GAUZE 2X2 STER 10/PKG (GAUZE/BANDAGES/DRESSINGS) ×2
SPONGE LAP 18X18 RF (DISPOSABLE) ×3 IMPLANT
SPONGE LAP 4X18 RFD (DISPOSABLE) ×3 IMPLANT
SUT ETHILON 4 0 PS 2 18 (SUTURE) IMPLANT
SUT ETHILON 5 0 P 3 18 (SUTURE)
SUT NYLON ETHILON 5-0 P-3 1X18 (SUTURE) IMPLANT
SUT PROLENE 5 0 P 3 (SUTURE) ×4 IMPLANT
SWAB COLLECTION DEVICE MRSA (MISCELLANEOUS) ×3 IMPLANT
SWAB CULTURE ESWAB REG 1ML (MISCELLANEOUS) IMPLANT
SYR CONTROL 10ML LL (SYRINGE) IMPLANT
TOWEL OR 17X24 6PK STRL BLUE (TOWEL DISPOSABLE) ×3 IMPLANT
TOWEL OR 17X26 10 PK STRL BLUE (TOWEL DISPOSABLE) ×3 IMPLANT
TUBE CONNECTING 12'X1/4 (SUCTIONS) ×1
TUBE CONNECTING 12X1/4 (SUCTIONS) ×2 IMPLANT
UNDERPAD 30X30 (UNDERPADS AND DIAPERS) ×3 IMPLANT
WATER STERILE IRR 1000ML POUR (IV SOLUTION) ×3 IMPLANT
YANKAUER SUCT BULB TIP NO VENT (SUCTIONS) ×3 IMPLANT

## 2018-05-01 NOTE — Addendum Note (Signed)
Addendum  created 05/01/18 2202 by Gwenyth Allegra, CRNA   Intraprocedure Meds edited

## 2018-05-01 NOTE — Op Note (Signed)
PREOPERATIVE DIAGNOSIS: Left index finger open distal phalanx fracture  POSTOPERATIVE DIAGNOSIS: Same  ATTENDING SURGEON: Dr. Bradly Bienenstock who is scrubbed and present for the entire procedure  ASSISTANT SURGEON: None  ANESTHESIA: General via LMA  OPERATIVE PROCEDURE: #1: Open treatment of left index finger distal phalanx fracture without internal fixation #2: Left index finger volar advancement flap #3: Open debridement of skin and subcutaneous tissue and bone left index finger distal phalanx fracture  IMPLANTS: None  RADIOGRAPHIC INTERPRETATION: None  SURGICAL INDICATIONS: Patient is a right-hand-dominant gentleman who sustained the open distal phalanx fracture from a table saw.  Patient was seen and evaluated and recommended to undergo the above procedure.  Risks of surgery include but not limited to bleeding infection damage nearby nerves arteries or tendons loss of motion of the wrist and digits incomplete relief of symptoms and need for further surgical intervention.  SURGICAL TECHNIQUE: Patient was brought identified in the preoperative holding area and marked with a permanent marker made on the left index finger to indicate the correct operative site.  Patient brought back to operating placed supine on the anesthesia table where general anesthesia was administered.  Preoperative antibiotics were given prior to any skin incision.  A well-padded tourniquet was then placed on the left brachium and stay with appropriate drape.  Left upper extremities then prepped and draped in normal sterile fashion.  A timeout was called the correct site was identified the procedure then begun.  Attention was then turned to the index finger.  Open debridement was then carried out of the skin and subcutaneous tissue all the way down to the distal phalanx.  The distal phalanx was not unstable internal fixation was not used but open treatment of the left index finger distal phalanx fracture was done without  internal fixation.  Excisional break in the skin and subcutaneous tissue down to the bone was then carried out with small scissors and knife.  The wound was thoroughly irrigated.  Following this a VY volar advancement flap was then created this is able to cover the defect.  This was closed in the defect with simple Prolene sutures.  Adaptic dressing and a sterile compressive bandage then applied.  Quarter percent Marcaine infiltrated in the flexor sheath.  The wound was then sterilely dressed small compressive bandage was applied.  The patient was then extubated taken recovery in good condition.  POSTOPERATIVE PLAN: Patient be discharged to home.  Seen back in the office in 8 days for wound check down to see the therapist for small tip protector splint.  Radiographs the first visit.  Sutures out around the 2 to 3-week mark.

## 2018-05-01 NOTE — Anesthesia Preprocedure Evaluation (Signed)
Anesthesia Evaluation  Patient identified by MRN, date of birth, ID band Patient awake    Reviewed: Allergy & Precautions, H&P , NPO status , Patient's Chart, lab work & pertinent test results  Airway Mallampati: II   Neck ROM: full    Dental   Pulmonary asthma , Current Smoker,    breath sounds clear to auscultation       Cardiovascular negative cardio ROS   Rhythm:regular Rate:Normal     Neuro/Psych    GI/Hepatic   Endo/Other    Renal/GU      Musculoskeletal   Abdominal   Peds  Hematology   Anesthesia Other Findings   Reproductive/Obstetrics                             Anesthesia Physical Anesthesia Plan  ASA: II  Anesthesia Plan: General   Post-op Pain Management:    Induction: Intravenous  PONV Risk Score and Plan: 1 and Ondansetron, Dexamethasone, Midazolam and Treatment may vary due to age or medical condition  Airway Management Planned: LMA  Additional Equipment:   Intra-op Plan:   Post-operative Plan:   Informed Consent: I have reviewed the patients History and Physical, chart, labs and discussed the procedure including the risks, benefits and alternatives for the proposed anesthesia with the patient or authorized representative who has indicated his/her understanding and acceptance.       Plan Discussed with: CRNA, Anesthesiologist and Surgeon  Anesthesia Plan Comments:         Anesthesia Quick Evaluation

## 2018-05-01 NOTE — Anesthesia Postprocedure Evaluation (Signed)
Anesthesia Post Note  Patient: Mackey Birchwood  Procedure(s) Performed: IRRIGATION AND DEBRIDEMENT FINGER (Left )     Patient location during evaluation: PACU Anesthesia Type: General Level of consciousness: awake and alert Pain management: pain level controlled Vital Signs Assessment: post-procedure vital signs reviewed and stable Respiratory status: spontaneous breathing, nonlabored ventilation, respiratory function stable and patient connected to nasal cannula oxygen Cardiovascular status: blood pressure returned to baseline and stable Postop Assessment: no apparent nausea or vomiting Anesthetic complications: no    Last Vitals:  Vitals:   05/01/18 1940 05/01/18 1945  BP: 102/78   Pulse: 60 (!) 55  Resp: 16 13  Temp: 36.5 C   SpO2: 98% 98%    Last Pain:  Vitals:   05/01/18 1940  TempSrc:   PainSc: 0-No pain                 Kennieth Rad

## 2018-05-01 NOTE — ED Provider Notes (Signed)
[facility] EMERGENCY DEPARTMENT Provider Note   CSN: [id number] Arrival date & time: 05/01/18  1235    History   Chief Complaint Chief Complaint  Patient presents with  . Finger Injury    HPI Matthew Mendoza is a 43 y.o. male.     HPI   43 year old right-hand-dominant male presents today with complaints of tablesaw injury.  Patient notes he was doing an table saw when he cut his right index finger along the distal pad.  Patient immediately wrapped a tourniquet on the finger and came to the emergency room.  He is uncertain when his last tetanus shot was.  Denies any other injuries.  Mr. Matthew Mendoza notes that he has been drinking coffee all day but has not had any solid foods.  Past Medical History:  Diagnosis Date  . Asthma   . MRSA carrier     There are no active problems to display for this patient.   History reviewed. No pertinent surgical history.      Home Medications    Prior to Admission medications   Not on File    Family History Family History  Problem Relation Age of Onset  . Depression Mother     Social History Social History   Tobacco Use  . Smoking status: Current Every Day Smoker    Packs/day: 1.00    Types: Cigarettes  . Smokeless tobacco: Never Used  Substance Use Topics  . Alcohol use: No  . Drug use: No     Allergies   Patient has no known allergies.   Review of Systems Review of Systems  All other systems reviewed and are negative.   Physical Exam Updated Vital Signs BP (!) 124/94   Pulse (!) 111   Temp 97.6 F (36.4 C) (Oral)   Resp (!) 24   SpO2 100%   Physical Exam Vitals signs and nursing note reviewed.  Constitutional:      Appearance: He is well-developed.  HENT:     Head: Normocephalic and atraumatic.  Eyes:     General: No scleral icterus.       Right eye: No discharge.        Left eye: No discharge.     Conjunctiva/sclera: Conjunctivae normal.     Pupils: Pupils are equal, round, and  reactive to light.  Neck:     Musculoskeletal: Normal range of motion.     Vascular: No JVD.     Trachea: No tracheal deviation.  Pulmonary:     Effort: Pulmonary effort is normal.     Breath sounds: No stridor.  Musculoskeletal:     Comments: Significant soft tissue defect noted over the distal pad of the left second finger as noted in the photo unable to palpate the bone-DIP flexion extension intact-remainder of finger nontender to palpation  Neurological:     Mental Status: He is alert and oriented to person, place, and time.     Coordination: Coordination normal.  Psychiatric:        Behavior: Behavior normal.        Thought Content: Thought content normal.        Judgment: Judgment normal.     ED Treatments / Results  Labs (all labs ordered are listed, but only abnormal results are displayed) Labs Reviewed - No data to display  EKG None  Radiology Dg Finger Index Left  Result Date: 05/01/2018 CLINICAL DATA:  Cut finger with table saw EXAM: LEFT SECOND FINGER 2+V COMPARISON:  None. FINDINGS: Frontal, oblique, and lateral views obtained. There is soft tissue injury distally. No radiopaque foreign body. There is a cortical defect along the medial, volar aspect of the distal portion of the second distal phalanx, felt to represent an incomplete fracture. No complete fracture evident. No other evident fracture. No dislocation. Joint spaces appear normal. No erosive change. IMPRESSION: Apparent incomplete fracture along the volar, medial aspect of the distal portion of the second distal phalanx. No complete fracture. Fairly extensive soft tissue injury to the distal second digit. No radiopaque foreign body. No dislocation. No appreciable arthropathy. Electronically Signed   By: Bretta Bang III M.D.   On: 05/01/2018 13:44    Procedures Procedures (including critical care time)  Medications Ordered in ED Medications  lidocaine (XYLOCAINE) 2 % (with pres) injection 400 mg (has  no administration in time range)  lidocaine (PF) (XYLOCAINE) 1 % injection (has no administration in time range)  Tdap (BOOSTRIX) injection 0.5 mL (has no administration in time range)  ceFAZolin (ANCEF) IVPB 2g/100 mL premix (has no administration in time range)     Initial Impression / Assessment and Plan / ED Course  I have reviewed the triage vital signs and the nursing notes.  Pertinent labs & imaging results that were available during my care of the patient were reviewed by me and considered in my medical decision making (see chart for details).        Labs:   Imaging:  Consults:  Therapeutics: lidocaine   Discharge Meds:   Assessment/Plan: 80 YOF presents today with open fracture.  Tetanus updated, wound cleansed, and antibiotics ordered..  Orthopedic specialist consulted, patient will go to the OR for management.   Final Clinical Impressions(s) / ED Diagnoses   Final diagnoses:  Open nondisplaced fracture of distal phalanx of left index finger, initial encounter    ED Discharge Orders    None       Matthew Mechanic, PA-C 05/01/18 1517    Matthew Sous, MD 05/02/18 [phone]

## 2018-05-01 NOTE — Transfer of Care (Signed)
Immediate Anesthesia Transfer of Care Note  Patient: Matthew Mendoza  Procedure(s) Performed: IRRIGATION AND DEBRIDEMENT FINGER (Left )  Patient Location: PACU  Anesthesia Type:General  Level of Consciousness: awake, alert  and oriented  Airway & Oxygen Therapy: Patient Spontanous Breathing and Patient connected to nasal cannula oxygen  Post-op Assessment: Report given to RN and Post -op Vital signs reviewed and stable  Post vital signs: Reviewed and stable  Last Vitals:  Vitals Value Taken Time  BP 123/73 05/01/2018  7:07 PM  Temp    Pulse 57 05/01/2018  7:08 PM  Resp 19 05/01/2018  7:08 PM  SpO2 98 % 05/01/2018  7:08 PM  Vitals shown include unvalidated device data.  Last Pain:  Vitals:   05/01/18 1238  TempSrc: Oral  PainSc: 10-Worst pain ever         Complications: No apparent anesthesia complications

## 2018-05-01 NOTE — Consult Note (Addendum)
Reason for Consult:Left finger injury Referring Physician: [name] is an 43 y.o. male.  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 He came to the ED and hand surgery was consulted. 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  Past Medical History:  Diagnosis Date  . Asthma   . MRSA carrier     History reviewed. No pertinent surgical history.  Family History  Problem Relation Age of Onset  . Depression Mother     Social History:  reports that he has been smoking cigarettes. He has been smoking about 1.00 pack per day. He has never used smokeless tobacco. He reports that he does not drink alcohol or use drugs.  Allergies: No Known Allergies  Medications: I have reviewed the patient's current medications.  No results found for this or any previous visit (from the past 48 hour(s)).  Dg Finger Index Left  Result Date: 05/01/2018 CLINICAL DATA:  Cut finger with table saw EXAM: LEFT SECOND FINGER 2+V COMPARISON:  None. FINDINGS: Frontal, oblique, and lateral views obtained. There is soft tissue injury distally. No radiopaque foreign body. There is a cortical defect along the medial, volar aspect of the distal portion of the second distal phalanx, felt to represent an incomplete fracture. No complete fracture evident. No other evident fracture. No dislocation. Joint spaces appear normal. No erosive change. IMPRESSION: Apparent incomplete fracture along the volar, medial aspect of the distal portion of the second distal phalanx. No complete fracture. Fairly extensive soft tissue injury to the distal second digit. No radiopaque foreign body. No dislocation. No appreciable arthropathy. Electronically Signed   By: Bretta Bang III M.D.   On: 05/01/2018 13:44    Review of Systems  Constitutional: Negative for weight loss.  HENT: Negative for ear discharge, ear pain, hearing loss and tinnitus.   Eyes: Negative for  blurred vision, double vision, photophobia and pain.  Respiratory: Negative for cough, sputum production and shortness of breath.   Cardiovascular: Negative for chest pain.  Gastrointestinal: Negative for abdominal pain, nausea and vomiting.  Genitourinary: Negative for dysuria, flank pain, frequency and urgency.  Musculoskeletal: Positive for joint pain (Left index finger). Negative for back pain, falls, myalgias and neck pain.  Neurological: Negative for dizziness, tingling, sensory change, focal weakness, loss of consciousness and headaches.  Endo/Heme/Allergies: Does not bruise/bleed easily.  Psychiatric/Behavioral: Negative for depression, memory loss and substance abuse. The patient is not nervous/anxious.    Blood pressure (!) 124/94, pulse (!) 111, temperature 97.6 F (36.4 C), temperature source Oral, resp. rate (!) 24, SpO2 100 %. Physical Exam  Constitutional: He appears well-developed and well-nourished. No distress.  HENT:  Head: Normocephalic and atraumatic.  Eyes: Conjunctivae are normal. Right eye exhibits no discharge. Left eye exhibits no discharge. No scleral icterus.  Neck: Normal range of motion.  Cardiovascular: Normal rate and regular rhythm.  Respiratory: Effort normal. No respiratory distress.  Musculoskeletal:     Comments: Left shoulder, elbow, wrist, digits- Laceration with tissue deficit to pad of index finger, flexor mechanism intact, numb from digital block, no instability, no blocks to motion  Sens  Ax/R/M/U intact  Mot   Ax/ R/ PIN/ M/ AIN/ U intact  Rad 2+  Neurological: He is alert.  Skin: Skin is warm and dry. He is not diaphoretic.  Psychiatric: He has a normal mood and affect. His behavior is normal.  Assessment/Plan: Left index finger laceration -- Plan I&D, repair by Dr. Melvyn Novas this afternoon. NPO until then. Anticipate discharge after surgery.  The patient was seen and evaluated today. The plan is for irrigation debridement and  flap closure of the index finger. We went through the procedure Patient will go home following the procedure Patient voiced understanding the plan  Terriona Horlacher [facility] MD 05/01/2018     [name], PA-C Orthopedic Surgery [phone] 05/01/2018, 3:02 PM

## 2018-05-01 NOTE — Discharge Instructions (Signed)
KEEP BANDAGE CLEAN AND DRY °CALL OFFICE FOR F/U APPT [phone] IN 8 DAYS °ALSO CALL FOR THERAPY APPT IN 8 DAYS [phone] EXT 1601 °KEEP HAND ELEVATED ABOVE HEART °OK TO APPLY ICE TO OPERATIVE AREA °CONTACT OFFICE IF ANY WORSENING PAIN OR CONCERNS. °

## 2018-05-01 NOTE — Anesthesia Procedure Notes (Signed)
Procedure Name: LMA Insertion Date/Time: 05/01/2018 6:19 PM Performed by: Gwenyth Allegra, CRNA Pre-anesthesia Checklist: Patient identified, Emergency Drugs available, Suction available, Patient being monitored and Timeout performed Patient Re-evaluated:Patient Re-evaluated prior to induction Oxygen Delivery Method: Circle system utilized Preoxygenation: Pre-oxygenation with 100% oxygen Induction Type: IV induction LMA: LMA inserted LMA Size: 4.0 Placement Confirmation: positive ETCO2 and breath sounds checked- equal and bilateral Tube secured with: Tape Dental Injury: Teeth and Oropharynx as per pre-operative assessment

## 2018-05-01 NOTE — ED Triage Notes (Signed)
Pt cut the tip of his left index finger with a table saw

## 2018-05-02 ENCOUNTER — Encounter (HOSPITAL_COMMUNITY): Payer: Self-pay | Admitting: Orthopedic Surgery

## 2020-03-16 ENCOUNTER — Telehealth: Payer: Self-pay | Admitting: Family Medicine

## 2020-03-16 NOTE — Telephone Encounter (Signed)
Go ahead and send in Doxycycline 100 mg po bid with food and use warm compresses.   If not improving, needs to be seen either in ER or here if past isolation period.

## 2020-03-16 NOTE — Telephone Encounter (Signed)
Patient has an abscess on the back of his head.  He is requesting that Dr. Caryl Never call him in something. He is unable to come into the office because he did test positive for Covid on 03/16/20.  Virtual appointment was offered.   He states he has had several infections in the past and Dr. Caryl Never advised him to call the office to have a prescription called in.  Pharmacy: [facility] in [place]

## 2020-03-16 NOTE — Telephone Encounter (Signed)
Send in the Doxycycline 100 mg po bid for 10 days.

## 2020-03-17 MED ORDER — DOXYCYCLINE HYCLATE 100 MG PO CAPS
100.0000 mg | ORAL_CAPSULE | Freq: Two times a day (BID) | ORAL | 0 refills | Status: DC
Start: 2020-03-17 — End: 2023-02-27

## 2020-03-17 NOTE — Telephone Encounter (Signed)
Informed patient doxycycline was sent to pharmacy.  Follow up appointment scheduled.

## 2020-03-29 ENCOUNTER — Ambulatory Visit: Payer: Self-pay | Admitting: Family Medicine

## 2020-06-26 IMAGING — DX LEFT INDEX FINGER 2+V
1 series · 3 of 3 positions shown · non-contrast
Comparison: None.

CLINICAL DATA: Cut finger with table saw

EXAM:
LEFT SECOND FINGER 2+V

[Series 1: finger · 0.14mm/px · 3 of 3 slices shown]
[im 1/3]
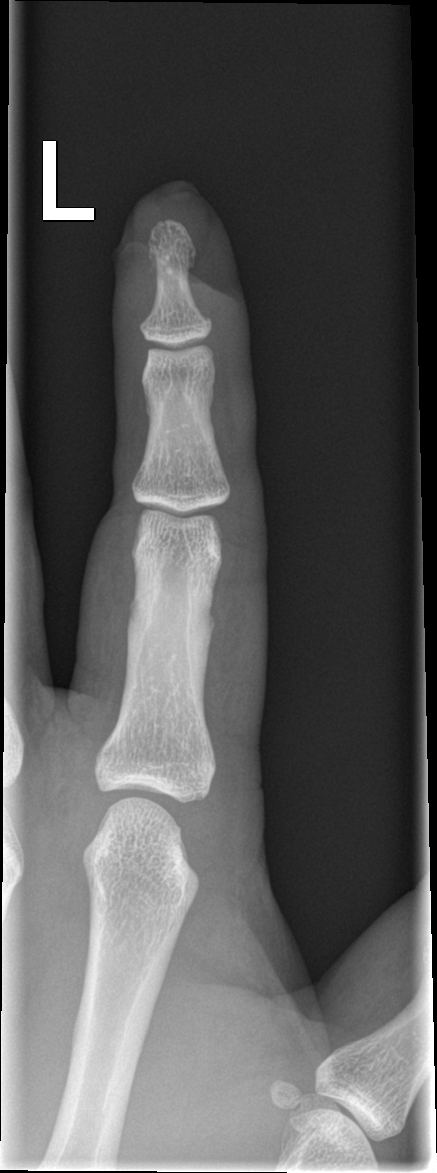
[im 2/3]
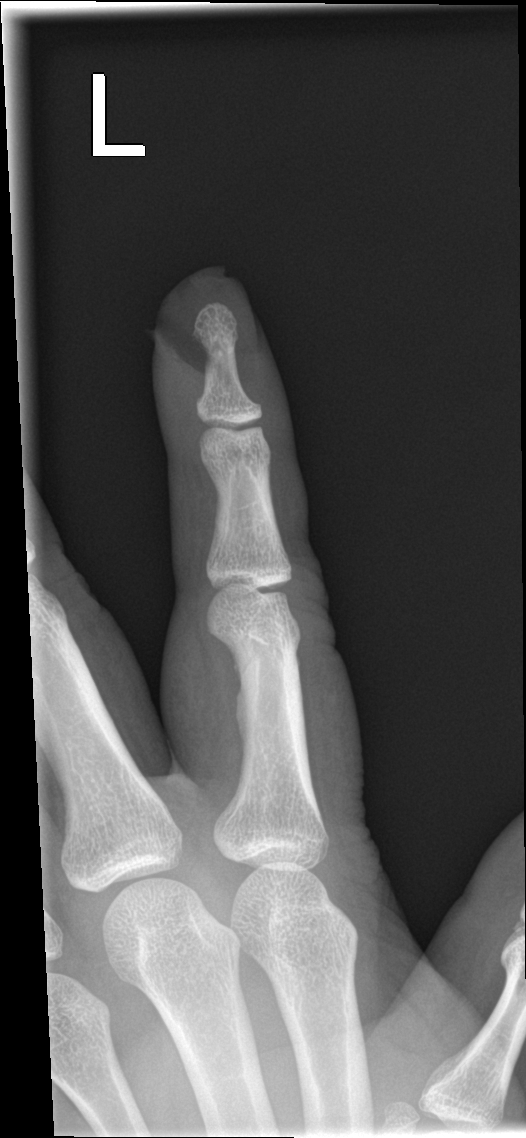
[im 3/3]
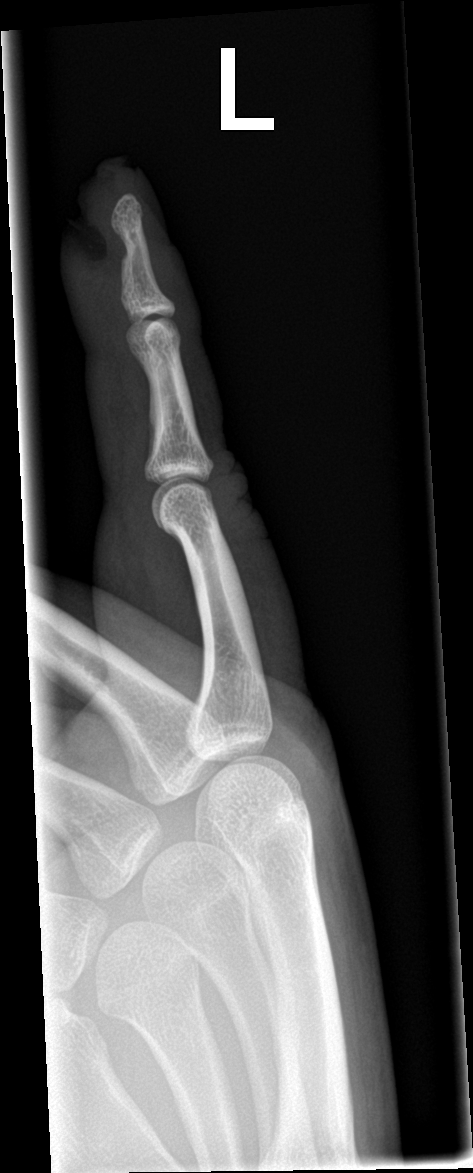

[3 of 3 positions shown; findings below may reference images not displayed]

FINDINGS: Frontal, oblique, and lateral views obtained. There is soft tissue
injury distally. No radiopaque foreign body. There is a cortical
defect along the medial, volar aspect of the distal portion of the
second distal phalanx, felt to represent an incomplete fracture. No
complete fracture evident. No other evident fracture. No
dislocation. Joint spaces appear normal. No erosive change.
IMPRESSION: Apparent incomplete fracture along the volar, medial aspect of the
distal portion of the second distal phalanx. No complete fracture.
Fairly extensive soft tissue injury to the distal second digit. No
radiopaque foreign body. No dislocation. No appreciable arthropathy.

## 2022-09-06 ENCOUNTER — Ambulatory Visit (INDEPENDENT_AMBULATORY_CARE_PROVIDER_SITE_OTHER): Payer: Medicaid Other | Admitting: Family Medicine

## 2022-09-06 ENCOUNTER — Encounter: Payer: Self-pay | Admitting: Family Medicine

## 2022-09-06 VITALS — BP 108/68 | HR 76 | Temp 98.3°F | Ht 70.0 in | Wt 210.0 lb

## 2022-09-06 DIAGNOSIS — M5412 Radiculopathy, cervical region: Secondary | ICD-10-CM | POA: Diagnosis not present

## 2022-09-06 DIAGNOSIS — S80869A Insect bite (nonvenomous), unspecified lower leg, initial encounter: Secondary | ICD-10-CM

## 2022-09-06 DIAGNOSIS — W57XXXA Bitten or stung by nonvenomous insect and other nonvenomous arthropods, initial encounter: Secondary | ICD-10-CM | POA: Diagnosis not present

## 2022-09-06 DIAGNOSIS — L97911 Non-pressure chronic ulcer of unspecified part of right lower leg limited to breakdown of skin: Secondary | ICD-10-CM

## 2022-09-06 MED ORDER — DOXYCYCLINE HYCLATE 100 MG PO CAPS: 100.0000 mg | ORAL_CAPSULE | Freq: Two times a day (BID) | ORAL | 0 refills | Status: AC

## 2022-09-06 NOTE — Progress Notes (Signed)
Established Patient Office Visit  Subjective   Patient ID: Matthew Mendoza, male    DOB: 1975-09-20  Age: 47 y.o. MRN: [id number]  Chief Complaint  Patient presents with   Insect Bite    Patient complains of insect bite, x1.5 weeks     HPI   Matthew Mendoza is seen with approximately 7 tick bites over the past 2 weeks.  These have been mostly on the extremities (legs).  He denies any headache or any fever.  No skin rash other than some occasional papules including 1 on his right calf which occurred a couple weeks ago.  He has had MRSA in the past and this looks somewhat similar.  No erythema migrans rash.  No petechiae.  Generally feels well.  Has had some occasional right-sided neck pains and tingling in the right upper extremity.  Right-hand-dominant.  Question of occasional weakness.  Symptoms come and go.  Work requires around reaching overhead at times.  This seems to exacerbate.  Past Medical History:  Diagnosis Date   Asthma    MRSA carrier    Past Surgical History:  Procedure Laterality Date   I & D EXTREMITY Left 05/01/2018   Procedure: IRRIGATION AND DEBRIDEMENT FINGER;  Surgeon: Bradly Bienenstock, MD;  Location: [facility] OR;  Service: Orthopedics;  Laterality: Left;    reports that he has been smoking cigarettes. He has never used smokeless tobacco. He reports that he does not drink alcohol and does not use drugs. family history includes Depression in his mother. No Known Allergies  Review of Systems  Constitutional:  Negative for chills and fever.  Respiratory:  Negative for cough.   Cardiovascular:  Negative for chest pain.  Neurological:  Negative for focal weakness and headaches.      Objective:     BP 108/68 (BP Location: Left Arm, Patient Position: Sitting, Cuff Size: Normal)   Pulse 76   Temp 98.3 F (36.8 C) (Oral)   Ht 5\' 10"  (1.778 m)   Wt 210 lb (95.3 kg)   SpO2 98%   BMI 30.13 kg/m  BP Readings from Last 3 Encounters:  09/06/22 108/68  05/01/18 102/78  03/21/18  110/70   Wt Readings from Last 3 Encounters:  09/06/22 210 lb (95.3 kg)  05/01/18 213 lb (96.6 kg)  03/21/18 220 lb 9.6 oz (100.1 kg)      Physical Exam Vitals reviewed.  Constitutional:      Appearance: Normal appearance.  Cardiovascular:     Rate and Rhythm: Normal rate and regular rhythm.  Pulmonary:     Effort: Pulmonary effort is normal.     Breath sounds: Normal breath sounds.  Skin:    Comments: Right calf reveals very small area of ulceration approximately 2 mm diameter.  No necrosis.  No foul odor.  No drainage.  This is at site of recent small pustule  Neurological:     Mental Status: He is alert.     Comments: Symmetric reflexes upper extremities.  Full strength      No results found for any visits on 09/06/22.    The ASCVD Risk score (Arnett DK, et al., 2019) failed to calculate for the following reasons:   Cannot find a previous HDL lab   Cannot find a previous total cholesterol lab    Assessment & Plan:   #1 multiple tick bites recently with past couple weeks.  Reviewed signs and symptoms of tick fever.  Follow-up immediately for any fever, headaches, or new rash.  #2  recent pustular lesion right calf.  Has small ulcer- at site of previous pustule.  History of MRSA.  Go ahead and cover with doxycycline 100 mg twice daily for 10 days.  Keep clean with soap and water.  Avoid alcohol or peroxide.  If not fully healed the next couple weeks recommend follow-up for biopsy  #3 intermittent right cervical radiculitis symptoms.  Nonfocal neuroexam at this time.  Observe for now.  If any progressive pain or weakness may need imaging to further assess  Evelena Peat, MD

## 2022-12-24 ENCOUNTER — Telehealth: Payer: Managed Care, Other (non HMO) | Admitting: Physician Assistant

## 2022-12-24 ENCOUNTER — Encounter: Payer: Managed Care, Other (non HMO) | Admitting: Nurse Practitioner

## 2022-12-24 DIAGNOSIS — L237 Allergic contact dermatitis due to plants, except food: Secondary | ICD-10-CM | POA: Diagnosis not present

## 2022-12-24 MED ORDER — PREDNISONE 10 MG PO TABS: 10.0000 mg | ORAL_TABLET | Freq: Every day | ORAL | 0 refills | Status: AC

## 2022-12-24 NOTE — Progress Notes (Addendum)
E-Visit for [employer]  We are sorry that you are not feeing well.  Here is how we plan to help!  Based on what you have shared with me it looks like you have had an allergic reaction to the oily resin from a group of plants.  This resin is very sticky, so it easily attaches to your skin, clothing, tools equipment, and pet's fur.    This blistering rash is often called poison ivy rash although it can come from contact with the leaves, stems and roots of poison ivy, poison oak and poison sumac.  The oily resin contains urushiol (u-ROO-she-ol) that produces a skin rash on exposed skin.  The severity of the rash depends on the amount of urushiol that gets on your skin.  A section of skin with more urushiol on it may develop a rash sooner.  The rash usually develops 12-48 hours after exposure and can last two to three weeks.  Your skin must come in direct contact with the plant's oil to be affected.  Blister fluid doesn't spread the rash.  However, if you come into contact with a piece of clothing or pet fur that has urushiol on it, the rash may spread out.  You can also transfer the oil to other parts of your body with your fingers.  Often the rash looks like a straight line because of the way the plant brushes against your skin.  Since your rash is widespread or has resulted in a large number of blisters, I have prescribed an oral corticosteroid.  Please follow these recommendations:  I have sent a prednisone dose pack to your chosen pharmacy. Be sure to follow the instructions carefully and complete the entire prescription. You may use Benadryl or Caladryl topical lotions to sooth the itch and remember cool, not hot, showers and baths can help relieve the itching!  Place cool, wet compresses on the affected area for 15-30 minutes several times a day.  You may also take oral antihistamines, such as diphenhydramine (Benadryl, others), which may also help you sleep better.  Watch your skin for any purulent  (pus) drainage or red streaking from the site.  If this occurs, contact your provider.  You may require an antibiotic for a skin infection.  Make sure that the clothes you were wearing as well as any towels or sheets that may have come in contact with the oil (urushiol) are washed in detergent and hot water.       I have developed the following plan to treat your condition I am prescribing a two week course of steroids (37 tablets of 10 mg prednisone).  Days 1-4 take 4 tablets (40 mg) daily  Days 5-8 take 3 tablets (30 mg) daily, Days 9-11 take 2 tablets (20 mg) daily, Days 12-14 take 1 tablet (10 mg) daily.    What can you do to prevent this rash?  Avoid the plants.  Learn how to identify poison ivy, poison oak and poison sumac in all seasons.  When hiking or engaging in other activities that might expose you to these plants, try to stay on cleared pathways.  If camping, make sure you pitch your tent in an area free of these plants.  Keep pets from running through wooded areas so that urushiol doesn't accidentally stick to their fur, which you may touch.  Remove or kill the plants.  In your yard, you can get rid of poison ivy by applying an herbicide or pulling it out of  the ground, including the roots, while wearing heavy gloves.  Afterward remove the gloves and thoroughly wash them and your hands.  Don't burn poison ivy or related plants because the urushiol can be carried by smoke.  Wear protective clothing.  If needed, protect your skin by wearing socks, boots, pants, long sleeves and vinyl gloves.  Wash your skin right away.  Washing off the oil with soap and water within 30 minutes of exposure may reduce your chances of getting a poison ivy rash.  Even washing after an hour or so can help reduce the severity of the rash.  If you walk through some poison ivy and then later touch your shoes, you may get some urushiol on your hands, which may then transfer to your face or body by touching or  rubbing.  If the contaminated object isn't cleaned, the urushiol on it can still cause a skin reaction years later.    Be careful not to reuse towels after you have washed your skin.  Also carefully wash clothing in detergent and hot water to remove all traces of the oil.  Handle contaminated clothing carefully so you don't transfer the urushiol to yourself, furniture, rugs or appliances.  Remember that pets can carry the oil on their fur and paws.  If you think your pet may be contaminated with urushiol, put on some long rubber gloves and give your pet a bath.  Finally, be careful not to burn these plants as the smoke can contain traces of the oil.  Inhaling the smoke may result in difficulty breathing. If that occurred you should see a physician as soon as possible.  See your doctor right away if:  The reaction is severe or widespread You inhaled the smoke from burning poison ivy and are having difficulty breathing Your skin continues to swell The rash affects your eyes, mouth or genitals Blisters are oozing pus You develop a fever greater than 100 F (37.8 C) The rash doesn't get better within a few weeks.  If you scratch the poison ivy rash, bacteria under your fingernails may cause the skin to become infected.  See your doctor if pus starts oozing from the blisters.  Treatment generally includes antibiotics.  Poison ivy treatments are usually limited to self-care methods.  And the rash typically goes away on its own in two to three weeks.     If the rash is widespread or results in a large number of blisters, your doctor may prescribe an oral corticosteroid, such as prednisone.  If a bacterial infection has developed at the rash site, your doctor may give you a prescription for an oral antibiotic.  MAKE SURE YOU  Understand these instructions. Will watch your condition. Will get help right away if you are not doing well or get worse.   Thank you for choosing an e-visit.  Your  e-visit answers were reviewed by a board certified advanced clinical practitioner to complete your personal care plan. Depending upon the condition, your plan could have included both over the counter or prescription medications.  Please review your pharmacy choice. Make sure the pharmacy is open so you can pick up prescription now. If there is a problem, you may contact your provider through [employer] and have the prescription routed to another pharmacy.  Your safety is important to [place]. If you have drug allergies check your prescription carefully.   For the next 24 hours you can use MyChart to ask questions about today's visit, request a non-urgent  call back, or ask for a work or school excuse. You will get an email in the next two days asking about your experience. I hope that your e-visit has been valuable and will speed your recovery.  I have spent 5 minutes in review of e-visit questionnaire, review and updating patient chart, medical decision making and response to patient.   Tylene Fantasia Ward, PA-C

## 2022-12-24 NOTE — Progress Notes (Signed)
Mr Brafford was not aware he was already sent prednisone for poison ivy via evisit earlier today. He does not need the virtual visit.

## 2023-02-27 ENCOUNTER — Ambulatory Visit (INDEPENDENT_AMBULATORY_CARE_PROVIDER_SITE_OTHER): Payer: BC Managed Care – PPO | Admitting: Family Medicine

## 2023-02-27 ENCOUNTER — Encounter: Payer: Self-pay | Admitting: Family Medicine

## 2023-02-27 VITALS — BP 140/80 | HR 81 | Temp 98.3°F | Wt 218.4 lb

## 2023-02-27 DIAGNOSIS — S39012A Strain of muscle, fascia and tendon of lower back, initial encounter: Secondary | ICD-10-CM

## 2023-02-27 MED ORDER — NAPROXEN 500 MG PO TABS: 500.0000 mg | ORAL_TABLET | Freq: Two times a day (BID) | ORAL | 0 refills | Status: AC

## 2023-02-27 MED ORDER — CYCLOBENZAPRINE HCL 10 MG PO TABS: 10.0000 mg | ORAL_TABLET | Freq: Three times a day (TID) | ORAL | 0 refills | Status: DC | PRN

## 2023-02-27 NOTE — Progress Notes (Signed)
   Established Patient Office Visit  Subjective   Patient ID: Matthew Mendoza, male    DOB: 12-Jan-1976  Age: 48 y.o. MRN: [id number]  Chief Complaint  Patient presents with   Back Pain    HPI   Matthew Mendoza is seen today with acute low back pain mostly left lower lumbar region.  His job is fairly physical.  He was pulling on a filing cabinet which was difficult open earlier today when he felt a pop sensation in his lower back region.  Pain progressive through the day.  Later in the day was reaching up and felt severe pain somewhat poorly localized lower lumbar area.  No radiculitis symptoms.  Took three 200 mg Advil  and did notice some improvement with that.  Denies any lower extremity numbness or weakness.  No chronic back difficulties.  Past Medical History:  Diagnosis Date   Asthma    MRSA carrier    Past Surgical History:  Procedure Laterality Date   I & D EXTREMITY Left 05/01/2018   Procedure: IRRIGATION AND DEBRIDEMENT FINGER;  Surgeon: Shari Easter, MD;  Location: [facility] OR;  Service: Orthopedics;  Laterality: Left;    reports that he has been smoking cigarettes. He has never used smokeless tobacco. He reports that he does not drink alcohol and does not use drugs. family history includes Depression in his mother. No Known Allergies  Review of Systems  Constitutional:  Negative for chills and fever.  Musculoskeletal:  Positive for back pain.  Neurological:  Negative for focal weakness.      Objective:     BP (!) 140/80 (BP Location: Left Arm, Patient Position: Sitting, Cuff Size: Normal)   Pulse 81   Temp 98.3 F (36.8 C) (Oral)   Wt 218 lb 6.4 oz (99.1 kg)   SpO2 100%   BMI 31.34 kg/m  BP Readings from Last 3 Encounters:  02/27/23 (!) 140/80  09/06/22 108/68  05/01/18 102/78   Wt Readings from Last 3 Encounters:  02/27/23 218 lb 6.4 oz (99.1 kg)  09/06/22 210 lb (95.3 kg)  05/01/18 213 lb (96.6 kg)      Physical Exam Vitals reviewed.  Constitutional:       General: He is not in acute distress.    Appearance: He is not ill-appearing.  Cardiovascular:     Rate and Rhythm: Normal rate and regular rhythm.  Musculoskeletal:     Comments: Straight leg raise are negative bilaterally.  He has no spinal tenderness.  Does have some left lower lumbar poorly localized muscular tenderness.  Neurological:     Mental Status: He is alert.     Comments: Full strength lower extremities.  2+ patellar and 1+ Achilles reflexes bilaterally      No results found for any visits on 02/27/23.    The ASCVD Risk score (Arnett DK, et al., 2019) failed to calculate for the following reasons:   Cannot find a previous HDL lab   Cannot find a previous total cholesterol lab    Assessment & Plan:   Acute lumbar back strain.  Suspect myofascial strain.  Recommend conservative measures with ice, heat, topical sports creams. -Recommend short-term trial of naproxen  500 mg every 12 hours -Flexeril  10 mg nightly as needed muscle spasm.  He is aware this may cause some sedation -Work note was written for the next 2 days to be out on the eighth and ninth -Consider trial of physical therapy for any persistent symptoms  Wolm Scarlet, MD

## 2023-03-02 ENCOUNTER — Ambulatory Visit: Payer: Self-pay | Admitting: Family Medicine

## 2023-03-02 NOTE — Telephone Encounter (Signed)
 Noted.  Agree with advice.  Kristian Covey MD [facility]

## 2023-03-02 NOTE — Telephone Encounter (Signed)
 Copied from CRM [id number]. Topic: Clinical - Red Word Triage >> Mar 02, 2023 10:32 AM Matthew Mendoza wrote: Matthew Mendoza that prompted transfer to Nurse Triage: Patient saw Dr. Micheal earlier in week for back pain. If he puts weight on left foot it leads to back pain and he will get a jolt in the nerves through his legs. Pain is 10/10 and it gets to the point he can't stand for more than 20 minutes.   Chief Complaint: Back Pain Symptoms: Pain, tingling/pain down left leg Frequency: x 3 days Pertinent Negatives: Patient denies bowel/bladder issues, trouble urinating, fevers, weakness, numbness Disposition: [] ED /[] Urgent Care (no appt availability in office) / [x] Appointment(In office/virtual)/ []  [facility]/ [] Home Care/ [] Refused Recommended Disposition /[] [facility]/ []  Follow-up with PCP Additional Notes: Patient called and advised that he came in 3 days ago after a back injury at work.  He states that he is only slightly better but now the only way to get any relief is to stay off his feet.  Patient advised that Dr Matthew Mendoza told him that if it continues to hurt to call back to the office and come in for a possible x-ray. Patient states that sometimes he will get some tingling down his left leg every so often if he is moving around a lot and then finally sits down but this is not constant.  He feels like when he stands and walks for a few minutes he feels like the muscles are starting to tighten up.  Patient states that he sits down and the pain goes away and eases up where he can move around again.  Patient states that his pain goes up to around a ten if he has been walking around a lot but when he is sitting or just starting to move around it is at a calm 2-3 out of ten.  Patient is given home care advice.  Patient states that he has been sleeping with a pillow between his knees that helps and he has been using a heat pack as well.  Patient was already made an appointment with his  PCP Dr Matthew Mendoza Monday at 2:45pm. Patient is advised that if anything gets worse, to go to the emergency room. Patient verbalized understanding.  Reason for Disposition  [1] Pain radiates into the thigh or further down the leg AND [2] one leg  Answer Assessment - Initial Assessment Questions 1. ONSET: When did the pain begin?      3 days ago--pt came to office 2. LOCATION: Where does it hurt? (upper, mid or lower back)     Lower back 3. SEVERITY: How bad is the pain?  (e.g., Scale 1-10; mild, moderate, or severe)   - MILD (1-3): Doesn't interfere with normal activities.    - MODERATE (4-7): Interferes with normal activities or awakens from sleep.    - SEVERE (8-10): Excruciating pain, unable to do any normal activities.      2-3 soft ache sitting down    standing for a few minutes it goes over 10 4. PATTERN: Is the pain constant? (e.g., yes, no; constant, intermittent)      Not any better than when he was here 3 days ago 5. RADIATION: Does the pain shoot into your legs or somewhere else?     Down left leg into thigh area 6. CAUSE:  What do you think is causing the back pain?      Back injury at work 3 days ago 7. BACK  OVERUSE:  Any recent lifting of heavy objects, strenuous work or exercise?     Walking or stepping--max of 20 minutes standing before he feels like he has to sit down 8. MEDICINES: What have you taken so far for the pain? (e.g., nothing, acetaminophen , NSAIDS)     Prescribed meds 9. NEUROLOGIC SYMPTOMS: Do you have any weakness, numbness, or problems with bowel/bladder control?     No 10. OTHER SYMPTOMS: Do you have any other symptoms? (e.g., fever, abdomen pain, burning with urination, blood in urine)       Tingling down left leg after sitting down off and on at times  Protocols used: Back Pain-A-AH

## 2023-03-05 ENCOUNTER — Ambulatory Visit (INDEPENDENT_AMBULATORY_CARE_PROVIDER_SITE_OTHER): Payer: BC Managed Care – PPO | Admitting: Family Medicine

## 2023-03-05 ENCOUNTER — Encounter: Payer: Self-pay | Admitting: Family Medicine

## 2023-03-05 VITALS — BP 116/70 | HR 80 | Temp 98.0°F | Wt 221.2 lb

## 2023-03-05 DIAGNOSIS — S39012D Strain of muscle, fascia and tendon of lower back, subsequent encounter: Secondary | ICD-10-CM | POA: Diagnosis not present

## 2023-03-05 MED ORDER — PREDNISONE 20 MG PO TABS: ORAL_TABLET | ORAL | 0 refills | Status: DC

## 2023-03-05 NOTE — Progress Notes (Signed)
 Established Patient Office Visit  Subjective   Patient ID: Matthew Mendoza, male    DOB: [birth year]  Age: 48 y.o. MRN: [id number]  Chief Complaint  Patient presents with   Back Pain    HPI   [name] is here for follow-up regarding low back pain which is predominantly left lower lumbar region.  Refer to prior note for detail.  His initial pain started at work when he was pulling on a filing cabinet on 1 - 7 - 25.  We suspected myofascial strain and started naproxen  500 mg every 12 hours and Flexeril  at night.  He has not seen much benefit with the naproxen .  Flexeril  does cause some sedation, as expected.  He is only taking this at night.  Pain is really not much improved if any.  He has achy pain to sometimes burning quality mostly left lower lumbar region.  Occasional radiation toward the left thigh.  Denies any lower extremity weakness.  No urine or stool incontinence.  Has had occasional tingling sensation left thigh region.  He has a job is fairly physical in terms of getting in and out of a vehicle about 20 times per day and also a lot of squatting and reaching and bending.  Past Medical History:  Diagnosis Date   Asthma    MRSA carrier    Past Surgical History:  Procedure Laterality Date   I & D EXTREMITY Left 05/01/2018   Procedure: IRRIGATION AND DEBRIDEMENT FINGER;  Surgeon: Shari Easter, MD;  Location: [facility] OR;  Service: Orthopedics;  Laterality: Left;    reports that he has been smoking cigarettes. He has never used smokeless tobacco. He reports that he does not drink alcohol and does not use drugs. family history includes Depression in his mother. No Known Allergies  Review of Systems  Constitutional:  Negative for fever.  Cardiovascular:  Negative for chest pain.  Gastrointestinal:  Negative for nausea and vomiting.  Genitourinary:  Negative for dysuria.  Musculoskeletal:  Positive for back pain.  Neurological:  Negative for focal weakness.      Objective:     BP  116/70 (BP Location: Left Arm, Patient Position: Sitting, Cuff Size: Normal)   Pulse 80   Temp 98 F (36.7 C) (Oral)   Wt 221 lb 3.2 oz (100.3 kg)   SpO2 99%   BMI 31.74 kg/m  BP Readings from Last 3 Encounters:  03/05/23 116/70  02/27/23 (!) 140/80  09/06/22 108/68   Wt Readings from Last 3 Encounters:  03/05/23 221 lb 3.2 oz (100.3 kg)  02/27/23 218 lb 6.4 oz (99.1 kg)  09/06/22 210 lb (95.3 kg)      Physical Exam Vitals reviewed.  Constitutional:      General: He is not in acute distress.    Appearance: He is not ill-appearing.  Cardiovascular:     Rate and Rhythm: Normal rate and regular rhythm.  Pulmonary:     Effort: Pulmonary effort is normal.     Breath sounds: Normal breath sounds.  Musculoskeletal:     Comments: Straight leg raise on the left is negative.  Neurological:     General: No focal deficit present.     Mental Status: He is alert.     Comments: Full strength with plantarflexion and dorsiflexion on the left. 2+ patellar reflex and 1+ Achilles bilaterally      No results found for any visits on 03/05/23.    The ASCVD Risk score (Arnett DK, et al., 2019) failed  to calculate for the following reasons:   Cannot find a previous HDL lab   Cannot find a previous total cholesterol lab    Assessment & Plan:   Acute lumbar back pain with onset 6 days ago at work pulling on a heavy filing cabinet.  Occasional tingling sensation left thigh but most of his pain is confined lower lumbar region.  Not much improvement with naproxen  and Flexeril .  -Continue Flexeril  at night -Consider prednisone  20 mg 2 tablets daily for 6 days -Consider trial of physical therapy -We did extend his work note out for another week.  He is checking with his employer regarding whether this should be filed under [employer]  Wolm Scarlet, MD

## 2023-03-08 ENCOUNTER — Encounter: Payer: Self-pay | Admitting: Physical Therapy

## 2023-03-08 ENCOUNTER — Ambulatory Visit: Payer: Worker's Compensation | Attending: Family Medicine | Admitting: Physical Therapy

## 2023-03-08 ENCOUNTER — Other Ambulatory Visit: Payer: Self-pay

## 2023-03-08 DIAGNOSIS — M6281 Muscle weakness (generalized): Secondary | ICD-10-CM | POA: Insufficient documentation

## 2023-03-08 DIAGNOSIS — R293 Abnormal posture: Secondary | ICD-10-CM | POA: Insufficient documentation

## 2023-03-08 DIAGNOSIS — S39012D Strain of muscle, fascia and tendon of lower back, subsequent encounter: Secondary | ICD-10-CM | POA: Insufficient documentation

## 2023-03-08 DIAGNOSIS — M5459 Other low back pain: Secondary | ICD-10-CM | POA: Diagnosis present

## 2023-03-08 NOTE — Therapy (Addendum)
OUTPATIENT PHYSICAL THERAPY NEURO EVALUATION   Patient Name: Matthew Mendoza MRN: [id number] DOB:03/30/1975, 48 y.o., male Today's Date: 03/08/2023   PCP: Kristian Covey, MD REFERRING PROVIDER: Kristian Covey, MD  END OF SESSION:  PT End of Session - 03/08/23 [phone]     Visit Number 1    Number of Visits 12    Date for PT Re-Evaluation 04/13/23    Authorization Type BCBS    PT Start Time 0930    PT Stop Time 1019    PT Time Calculation (min) 49 min    Activity Tolerance Patient limited by pain    Behavior During Therapy Restless             Past Medical History:  Diagnosis Date   Asthma    MRSA carrier    Past Surgical History:  Procedure Laterality Date   I & D EXTREMITY Left 05/01/2018   Procedure: IRRIGATION AND DEBRIDEMENT FINGER;  Surgeon: Bradly Bienenstock, MD;  Location: [facility] OR;  Service: Orthopedics;  Laterality: Left;   There are no active problems to display for this patient.   ONSET DATE: 03/05/2023 (MD referral); pain began 02/27/2023  REFERRING DIAG: S39.012D (ICD-10-CM) - Strain of lumbar region, subsequent encounter   THERAPY DIAG:  Other low back pain  Muscle weakness (generalized)  Abnormal posture  Rationale for Evaluation and Treatment: Rehabilitation  SUBJECTIVE:                                                                                                                                                                                             SUBJECTIVE STATEMENT: Was at work on 02/27/23 and pulled on file cabinet.  Felt something in my left low back.  Standing is difficult after 10 minutes or so, with burning pain in L thigh; it will go down the back of my leg into my foot.  L leg tingles.  Sitting is starting to bother me.  Am feeling a little better after starting the prednisone on Friday. Pt accompanied by: self  PERTINENT HISTORY: L lumbar region back pain began 02/27/23, saw Dr. Caryl Never and was started on Naproxen/Flexeril without  much reported benefit; occasional tingling L thigh  PAIN:  Are you having pain? Yes: NPRS scale: 4-5/10 sitting;  up to 10/10standing Pain location: L low back Pain description: hollow pain, tingling radiates into L thigh and posterior leg into foot  Aggravating factors: standing, walking, increased weight on L foot Relieving factors: sitting; prednisone has helped a little, heating pad/ warm shower  PRECAUTIONS: None  RED FLAGS: Pt does complain tingling into L foot  WEIGHT BEARING RESTRICTIONS: No  FALLS: Has patient fallen in last 6 months? No  LIVING ENVIRONMENT: Lives with: lives with their family Lives in: House/apartment Stairs: 2 steps onto deck Has following equipment at home: None  PLOF: Independent and Vocation/Vocational requirements: pushing, lifting, getting in and out of car multiple times  PATIENT GOALS: To get back to normal mobility and get rid of pain  OBJECTIVE:  Note: Objective measures were completed at Evaluation unless otherwise noted.  DIAGNOSTIC FINDINGS: NA  COGNITION: Overall cognitive status: Within functional limits for tasks assessed   SENSATION: Light touch: Impaired  and feels lighter touch on LLE, especially distally  MUSCLE LENGTH:  SLR:  LLE 23 degrees, very painful in L lumbar area Attempted 90/90 hamstring measure, unable due to L lumbar pain  POSTURE: rounded shoulders, forward head, and very guarded/antalgic posture; decreased weightshift to LLE in sitting  Lumbar ROM: Flexion 20 degrees Extension 10 degrees  Repeated flexion:  pain more into L shoulder (incr to 6/10 from 5/10) Repeated extension pain into thoracic spine and into L leg (5/10)  Palpation:  Pt very tender to palpation L lumbar musculature, paraspinals, L4-L5 facet area.  Tender to palpation at L buttocks and piriformis  LOWER EXTREMITY ROM Seated hamstring motion limited and guarded LLE  LOWER EXTREMITY MMT:    MMT Right Eval Left Eval  Hip flexion  4+ 3+ pain  Hip extension    Hip abduction    Hip adduction    Hip internal rotation    Hip external rotation    Knee flexion 4 3+ pain  Knee extension 3+ 3+  Ankle dorsiflexion 4 4  Ankle plantarflexion    Ankle inversion    Ankle eversion    (Blank rows = not tested)  TRANSFERS: Assistive device utilized:  BUE support, slowed, guarded, decreased LLE weightbearing and None  Sit to stand: Modified independence Stand to sit: Modified independence  GAIT: Gait pattern: step through pattern and antalgic Distance walked: 40 ft Assistive device utilized: None Level of assistance: Modified independence Comments: slowed, guarded, antalgic pattern   PATIENT SURVEYS:  Modified Oswestry Score = 30; 60% (41%-60% indicates severe disability)                                                                                                                               TREATMENT DATE: 03/08/2023    PATIENT EDUCATION: Education details: Eval results, POC; trying to find activities that centralize pain and avoid activities that cause pain to go more distally; trying to find gentle activities that less pain. Person educated: Patient Education method: Explanation, Demonstration, and Handouts (did not give the handouts) Education comprehension: verbalized understanding  HOME EXERCISE PROGRAM: Access Code: [email or web address] URL: [email or web address] Date: 03/08/2023 Prepared by: [facility] - Outpatient  Rehab - [facility]  Exercises - Supine Lower Trunk Rotation  - 1 x daily - 7 x weekly - 3 sets - 5 reps - Supine Posterior  Pelvic Tilt  - 1 x daily - 7 x weekly - 1-2 sets - 10 reps - Seated Pelvic Tilts  - 1 x daily - 7 x weekly - 1-2 sets - 10 reps - Hooklying Single Knee to Chest Stretch  - 1 x daily - 7 x weekly - 1 sets - 5 reps - 15-30 sec hold *At end of eval, pt does report only central low back pain, no radiating pain*  GOALS: Goals reviewed with patient? Yes  SHORT  TERM GOALS: Target date: 03/23/2023  Pt will be independent with HEP for improved pain, strength, flexibility. Baseline: Goal status: INITIAL  2.  Pt will report decrease in pain with standing activities by 50%. Baseline: can go up to 10/10 Goal status: INITIAL   LONG TERM GOALS: Target date: 04/13/2023  Pt will be independent with progression of HEP for improved pain, flexibility, strength, mobility. Baseline:  Goal status: INITIAL  2.  Pt will improve lumbar ROM to [facility], for improved painfree ROM. Baseline: See above Goal status: INITIAL  3.  Pt will verbalize posture/positioning/body mechanics for sleep, work, home activities to lessen pain. Baseline:  Goal status: INITIAL  4.  Modified Oswestry score to improve to 40% or less, to indicate decreased disability level due to back pain. Baseline: 60% Goal status: INITIAL  ASSESSMENT:  CLINICAL IMPRESSION: Patient is a 48 y.o. male who was seen today for physical therapy evaluation and treatment for low back pain.  He reports onset of L low back pain on 02/27/23 after pulling a file cabinet at work.  He has seen Dr. Caryl Never, with possible lumbar strain, and has been given medication to try to alleviate.  Pt reports little success with pain medication, with continued low back pain affecting daily activities.  At today's session, he is tender to palpation along L lumbar musculature, L piriformis and buttocks area; he has pain with A/ROM, attempts at passive SLR and hamstring stretch, resisted hip, knee motions.  He presents with guarded posture, antalgic gait pattern, and some radiating pain reports/parathesias into LLE.  With repeated motion, he does not report radiating pain into LLE, rather, he reports more pain in thoracic area of back, into shoulder area.  He also notes stressors of not being able to return to work and being used to providing for family.  With instruction on gentle posture/positioning information and gentle lumbar  flexibility exercises, pt reports only centralized low back pain at end of eval today.  He will benefit from skilled PT to address the above stated deficits to decrease pain and improve functional to return to his previous level of independence.   OBJECTIVE IMPAIRMENTS: Abnormal gait, decreased mobility, difficulty walking, decreased ROM, decreased strength, impaired flexibility, postural dysfunction, and pain.   ACTIVITY LIMITATIONS: lifting, bending, sitting, standing, squatting, sleeping, transfers, bed mobility, locomotion level, and caring for others  PARTICIPATION LIMITATIONS: driving, shopping, community activity, and occupation  PERSONAL FACTORS: 1-2 comorbidities: see above  are also affecting patient's functional outcome.   REHAB POTENTIAL: Good  CLINICAL DECISION MAKING: Evolving/moderate complexity  EVALUATION COMPLEXITY: Moderate  PLAN:  PT FREQUENCY: 2x/week  PT DURATION: 6 weeks  PLANNED INTERVENTIONS: 97110-Therapeutic exercises, 97530- Therapeutic activity, [id number]- Neuromuscular re-education, 97535- Self Care, 16109- Manual therapy, [id number]- Gait training, 97014- Electrical stimulation (unattended), [id number]- Electrical stimulation (manual), [id number]- Ultrasound, 60454- Traction (mechanical), Patient/Family education, Taping, Dry Needling, Joint mobilization, Cryotherapy, and Moist heat  PLAN FOR NEXT SESSION: Initiate HEP (again and give handouts); try STM for lumbar and piriformis.  Work to decrease peripheral symptoms/centralize symptoms and provide additional HEP exercises as appropriate.  ?Manual traction   Geraldina Parrott W., PT 03/08/2023, 1:52 PM  [facility] at [facility] [address] [place], [address] [place], [place] [address] Phone # [phone] Fax # [phone]

## 2023-03-09 ENCOUNTER — Ambulatory Visit: Payer: Worker's Compensation

## 2023-03-09 ENCOUNTER — Telehealth: Payer: Self-pay

## 2023-03-09 DIAGNOSIS — M5459 Other low back pain: Secondary | ICD-10-CM | POA: Diagnosis not present

## 2023-03-09 DIAGNOSIS — R293 Abnormal posture: Secondary | ICD-10-CM

## 2023-03-09 DIAGNOSIS — M6281 Muscle weakness (generalized): Secondary | ICD-10-CM

## 2023-03-09 NOTE — Therapy (Signed)
OUTPATIENT PHYSICAL THERAPY NEURO TREATMENT   Patient Name: Matthew Mendoza MRN: [id number] DOB:[birth year], 48 y.o., male Today's Date: 03/13/2023   PCP: Kristian Covey, MD REFERRING PROVIDER: Kristian Covey, MD  END OF SESSION:  PT End of Session - 03/13/23 1354     Visit Number 3    Number of Visits 12    Date for PT Re-Evaluation 04/13/23   corrected   Authorization Type BCBS    PT Start Time 1313    PT Stop Time 1353    PT Time Calculation (min) 40 min    Activity Tolerance Patient limited by pain;Patient tolerated treatment well    Behavior During Therapy Restless              Past Medical History:  Diagnosis Date   Asthma    MRSA carrier    Past Surgical History:  Procedure Laterality Date   I & D EXTREMITY Left 05/01/2018   Procedure: IRRIGATION AND DEBRIDEMENT FINGER;  Surgeon: Bradly Bienenstock, MD;  Location: [facility] OR;  Service: Orthopedics;  Laterality: Left;   There are no active problems to display for this patient.   ONSET DATE: 03/05/2023 (MD referral); pain began 02/27/2023  REFERRING DIAG: S39.012D (ICD-10-CM) - Strain of lumbar region, subsequent encounter   THERAPY DIAG:  Other low back pain  Muscle weakness (generalized)  Abnormal posture  Rationale for Evaluation and Treatment: Rehabilitation  SUBJECTIVE:                                                                                                                                                                                             SUBJECTIVE STATEMENT: Reports that he is doing his exercises and is tolerating them okay except for the one with twisting. Reports remaining pain/spasm in the L LE when getting in/out of the car, prolonged standing. Reports that the pain also occurs in the L side of the abdomen.    Pt accompanied by: self  PERTINENT HISTORY: L lumbar region back pain began 02/27/23, saw Dr. Caryl Never and was started on Naproxen/Flexeril without much reported benefit;  occasional tingling L thigh  PAIN:  Are you having pain? Yes: NPRS scale: 3-4/10 Pain location: L low back-left buttocks Pain description: hollow pain, tingling radiates into L thigh and posterior leg into foot  Aggravating factors: standing, walking, increased weight on L foot Relieving factors: sitting; prednisone has helped a little, heating pad/ warm shower  PRECAUTIONS: None  RED FLAGS: Pt does complain tingling into L foot    WEIGHT BEARING RESTRICTIONS: No  FALLS: Has patient fallen in last 6 months? No  LIVING ENVIRONMENT:  Lives with: lives with their family Lives in: House/apartment Stairs: 2 steps onto deck Has following equipment at home: None  PLOF: Independent and Vocation/Vocational requirements: pushing, lifting, getting in and out of car multiple times  PATIENT GOALS: To get back to normal mobility and get rid of pain  OBJECTIVE:      TODAY'S TREATMENT: 03/13/23 Activity Comments  review HEP: LTR  posterior pelvic tilts SKTC 30" each sitting pelvic tilts Cueing to perform within tolerable ROM especially with anterior pelvic tilts.   Reported tolerance for LTR after cues to perform within smaller ROM  hooklying sciatic nerve glides 20x With stretch out strap; report of stretch in the L anterior thigh  Hookyling HS  stretch with strap 30" Cueing for rhythmic breathing, relax head into pillow  Hookyling fig 4 and KTOS stretch 30" each Verbal cueing to avoid pushing into pain and rotating torso; good ROM; did not tolerate R KTOS as well as the L  Nustep  L5 x 4 min UEs/LEs With MHP to LB; cues to maintain pace; tolerated 4 min before c/o L anterior thigh pain        HOME EXERCISE PROGRAM Last updated: 03/13/23 Access Code: [email or web address] URL: [email or web address] Date: 03/13/2023 Prepared by: [facility] - Outpatient  Rehab - [facility]  Exercises - Supine Lower Trunk Rotation  - 1 x daily - 7 x weekly - 3 sets - 5 reps - Supine  Posterior Pelvic Tilt  - 1 x daily - 7 x weekly - 1-2 sets - 10 reps - Seated Pelvic Tilts  - 1 x daily - 7 x weekly - 1-2 sets - 10 reps - Hooklying Single Knee to Chest Stretch  - 1 x daily - 7 x weekly - 1 sets - 5 reps - 15-30 sec hold - Supine Sciatic Nerve Glide  - 1 x daily - 5 x weekly - 2 sets - 20 reps - Supine Figure 4 Piriformis Stretch  - 1 x daily - 5 x weekly - 2 sets - 30 reps   PATIENT EDUCATION: Education details: HEP update with cues to avoid pushing into pain  Person educated: Patient Education method: Explanation, Demonstration, Tactile cues, Verbal cues, and Handouts Education comprehension: verbalized understanding and returned demonstration      Note: Objective measures were completed at Evaluation unless otherwise noted.  DIAGNOSTIC FINDINGS: NA  COGNITION: Overall cognitive status: Within functional limits for tasks assessed   SENSATION: Light touch: Impaired  and feels lighter touch on LLE, especially distally  MUSCLE LENGTH:  SLR:  LLE 23 degrees, very painful in L lumbar area Attempted 90/90 hamstring measure, unable due to L lumbar pain  POSTURE: rounded shoulders, forward head, and very guarded/antalgic posture; decreased weightshift to LLE in sitting  Lumbar ROM: Flexion 20 degrees Extension 10 degrees  Repeated flexion:  pain more into L shoulder (incr to 6/10 from 5/10) Repeated extension pain into thoracic spine and into L leg (5/10)  Palpation:  Pt very tender to palpation L lumbar musculature, paraspinals, L4-L5 facet area.  Tender to palpation at L buttocks and piriformis  LOWER EXTREMITY ROM Seated hamstring motion limited and guarded LLE  LOWER EXTREMITY MMT:    MMT Right Eval Left Eval  Hip flexion 4+ 3+ pain  Hip extension    Hip abduction    Hip adduction    Hip internal rotation    Hip external rotation    Knee flexion 4 3+ pain  Knee extension 3+ 3+  Ankle dorsiflexion  4 4  Ankle plantarflexion    Ankle  inversion    Ankle eversion    (Blank rows = not tested)  TRANSFERS: Assistive device utilized:  BUE support, slowed, guarded, decreased LLE weightbearing and None  Sit to stand: Modified independence Stand to sit: Modified independence  GAIT: Gait pattern: step through pattern and antalgic Distance walked: 40 ft Assistive device utilized: None Level of assistance: Modified independence Comments: slowed, guarded, antalgic pattern   PATIENT SURVEYS:  Modified Oswestry Score = 30; 60% (41%-60% indicates severe disability)                                                                                                                                 GOALS: Goals reviewed with patient? Yes  SHORT TERM GOALS: Target date: 03/23/2023  Pt will be independent with HEP for improved pain, strength, flexibility. Baseline: Goal status: IN PROGRESS  2.  Pt will report decrease in pain with standing activities by 50%. Baseline: can go up to 10/10 Goal status: IN PROGRESS   LONG TERM GOALS: Target date: 04/13/2023  Pt will be independent with progression of HEP for improved pain, flexibility, strength, mobility. Baseline:  Goal status: IN PROGRESS  2.  Pt will improve lumbar ROM to [facility], for improved painfree ROM. Baseline: See above Goal status: IN PROGRESS  3.  Pt will verbalize posture/positioning/body mechanics for sleep, work, home activities to lessen pain. Baseline:  Goal status: IN PROGRESS  4.  Modified Oswestry score to improve to 40% or less, to indicate decreased disability level due to back pain. Baseline: 60% Goal status: IN PROGRESS  ASSESSMENT:  CLINICAL IMPRESSION: Patient arrived to session with reports of remaining pain with twisting, getting in/out of the car, prolonged standing. Reviewed HEP to assess tolerance- patient was educated on performing within slightly more limited ROM. Initiated gentle sciatic nerve glides, gentle LE stretching, and core  strengthening activities. Patient reported understanding of HEP update. Reported some R anterior thigh pain after Nustep at end of session.   OBJECTIVE IMPAIRMENTS: Abnormal gait, decreased mobility, difficulty walking, decreased ROM, decreased strength, impaired flexibility, postural dysfunction, and pain.   ACTIVITY LIMITATIONS: lifting, bending, sitting, standing, squatting, sleeping, transfers, bed mobility, locomotion level, and caring for others  PARTICIPATION LIMITATIONS: driving, shopping, community activity, and occupation  PERSONAL FACTORS: 1-2 comorbidities: see above  are also affecting patient's functional outcome.   REHAB POTENTIAL: Good  CLINICAL DECISION MAKING: Evolving/moderate complexity  EVALUATION COMPLEXITY: Moderate  PLAN:  PT FREQUENCY: 2x/week  PT DURATION: 6 weeks  PLANNED INTERVENTIONS: 97110-Therapeutic exercises, 97530- Therapeutic activity, [id number]- Neuromuscular re-education, 97535- Self Care, 16109- Manual therapy, [id number]- Gait training, 97014- Electrical stimulation (unattended), [id number]- Electrical stimulation (manual), [id number]- Ultrasound, 60454- Traction (mechanical), Patient/Family education, Taping, Dry Needling, Joint mobilization, Cryotherapy, and Moist heat  PLAN FOR NEXT SESSION: review HEP; try STM for lumbar and piriformis.  Work to decrease peripheral symptoms/centralize symptoms and provide additional HEP exercises as  appropriate.  ?Manual traction    Baldemar Friday, PT, DPT 03/13/23 1:56 PM  [facility] at [facility] [address] [place], [address] [place], [place] [address] Phone # [phone] Fax # [phone]

## 2023-03-09 NOTE — Telephone Encounter (Signed)
Copied from CRM [id number]. Topic: General - Other >> Mar 09, 2023 10:09 AM Denese Killings wrote: Reason for CRM: Patient stated that he has physical therapy twice a week. Patient wants to know if doctor can look at his notes from them and extend time away from work. Patient stated that he is still limited at what he can do.

## 2023-03-09 NOTE — Therapy (Signed)
OUTPATIENT PHYSICAL THERAPY NEURO TREATMENT   Patient Name: Matthew Mendoza MRN: [id number] DOB:11/20/1975, 48 y.o., male Today's Date: 03/09/2023   PCP: Kristian Covey, MD REFERRING PROVIDER: Kristian Covey, MD  END OF SESSION:  PT End of Session - 03/09/23 0845     Visit Number 2    Number of Visits 12    Date for PT Re-Evaluation 02/10/24    Authorization Type BCBS    PT Start Time 0845    PT Stop Time 0930    PT Time Calculation (min) 45 min    Activity Tolerance Patient limited by pain    Behavior During Therapy Restless             Past Medical History:  Diagnosis Date   Asthma    MRSA carrier    Past Surgical History:  Procedure Laterality Date   I & D EXTREMITY Left 05/01/2018   Procedure: IRRIGATION AND DEBRIDEMENT FINGER;  Surgeon: Bradly Bienenstock, MD;  Location: [facility] OR;  Service: Orthopedics;  Laterality: Left;   There are no active problems to display for this patient.   ONSET DATE: 03/05/2023 (MD referral); pain began 02/27/2023  REFERRING DIAG: S39.012D (ICD-10-CM) - Strain of lumbar region, subsequent encounter   THERAPY DIAG:  Other low back pain  Muscle weakness (generalized)  Abnormal posture  Rationale for Evaluation and Treatment: Rehabilitation  SUBJECTIVE:                                                                                                                                                                                             SUBJECTIVE STATEMENT: The left buttocks has been very tender since yesterday.  Moving slowly and very guarded to picking up things from the ground.  Having to keep the weight off the LLE.  Pt accompanied by: self  PERTINENT HISTORY: L lumbar region back pain began 02/27/23, saw Dr. Caryl Never and was started on Naproxen/Flexeril without much reported benefit; occasional tingling L thigh  PAIN:  Are you having pain? Yes: NPRS scale: 8/10 Pain location: L low back-left buttocks Pain description:  hollow pain, tingling radiates into L thigh and posterior leg into foot  Aggravating factors: standing, walking, increased weight on L foot Relieving factors: sitting; prednisone has helped a little, heating pad/ warm shower  PRECAUTIONS: None  RED FLAGS: Pt does complain tingling into L foot    WEIGHT BEARING RESTRICTIONS: No  FALLS: Has patient fallen in last 6 months? No  LIVING ENVIRONMENT: Lives with: lives with their family Lives in: House/apartment Stairs: 2 steps onto deck Has following equipment at home: None  PLOF: Independent  and Vocation/Vocational requirements: pushing, lifting, getting in and out of car multiple times  PATIENT GOALS: To get back to normal mobility and get rid of pain  OBJECTIVE:   TODAY'S TREATMENT: 03/09/23 Activity Comments  Supported prone w/ MHP and IFC E-stim X 10 min--notes referred LE pain absent after prone position  HEP review                PATIENT EDUCATION: Education details: Eval results, POC; trying to find activities that centralize pain and avoid activities that cause pain to go more distally; trying to find gentle activities that less pain. Person educated: Patient Education method: Explanation, Demonstration, and Handouts (did not give the handouts) Education comprehension: verbalized understanding  HOME EXERCISE PROGRAM: Access Code: [email or web address] URL: [email or web address] Date: 03/08/2023 Prepared by: [facility] - Outpatient  Rehab - [facility]  Exercises - Supine Lower Trunk Rotation  - 1 x daily - 7 x weekly - 3 sets - 5 reps - Supine Posterior Pelvic Tilt  - 1 x daily - 7 x weekly - 1-2 sets - 10 reps - Seated Pelvic Tilts  - 1 x daily - 7 x weekly - 1-2 sets - 10 reps - Hooklying Single Knee to Chest Stretch  - 1 x daily - 7 x weekly - 1 sets - 5 reps - 15-30 sec hold   Note: Objective measures were completed at Evaluation unless otherwise noted.  DIAGNOSTIC FINDINGS: NA  COGNITION: Overall  cognitive status: Within functional limits for tasks assessed   SENSATION: Light touch: Impaired  and feels lighter touch on LLE, especially distally  MUSCLE LENGTH:  SLR:  LLE 23 degrees, very painful in L lumbar area Attempted 90/90 hamstring measure, unable due to L lumbar pain  POSTURE: rounded shoulders, forward head, and very guarded/antalgic posture; decreased weightshift to LLE in sitting  Lumbar ROM: Flexion 20 degrees Extension 10 degrees  Repeated flexion:  pain more into L shoulder (incr to 6/10 from 5/10) Repeated extension pain into thoracic spine and into L leg (5/10)  Palpation:  Pt very tender to palpation L lumbar musculature, paraspinals, L4-L5 facet area.  Tender to palpation at L buttocks and piriformis  LOWER EXTREMITY ROM Seated hamstring motion limited and guarded LLE  LOWER EXTREMITY MMT:    MMT Right Eval Left Eval  Hip flexion 4+ 3+ pain  Hip extension    Hip abduction    Hip adduction    Hip internal rotation    Hip external rotation    Knee flexion 4 3+ pain  Knee extension 3+ 3+  Ankle dorsiflexion 4 4  Ankle plantarflexion    Ankle inversion    Ankle eversion    (Blank rows = not tested)  TRANSFERS: Assistive device utilized:  BUE support, slowed, guarded, decreased LLE weightbearing and None  Sit to stand: Modified independence Stand to sit: Modified independence  GAIT: Gait pattern: step through pattern and antalgic Distance walked: 40 ft Assistive device utilized: None Level of assistance: Modified independence Comments: slowed, guarded, antalgic pattern   PATIENT SURVEYS:  Modified Oswestry Score = 30; 60% (41%-60% indicates severe disability)  GOALS: Goals reviewed with patient? Yes  SHORT TERM GOALS: Target date: 03/23/2023  Pt will be independent with HEP for improved pain, strength,  flexibility. Baseline: Goal status: INITIAL  2.  Pt will report decrease in pain with standing activities by 50%. Baseline: can go up to 10/10 Goal status: INITIAL   LONG TERM GOALS: Target date: 04/13/2023  Pt will be independent with progression of HEP for improved pain, flexibility, strength, mobility. Baseline:  Goal status: INITIAL  2.  Pt will improve lumbar ROM to [facility], for improved painfree ROM. Baseline: See above Goal status: INITIAL  3.  Pt will verbalize posture/positioning/body mechanics for sleep, work, home activities to lessen pain. Baseline:  Goal status: INITIAL  4.  Modified Oswestry score to improve to 40% or less, to indicate decreased disability level due to back pain. Baseline: 60% Goal status: INITIAL  ASSESSMENT:  CLINICAL IMPRESSION: Returns to clinic with report of ongoing back pain more isolated to left buttocks and notes radiating symptoms into left thigh and occasional numbness/tingling in left dorsum of foot.  Session initiated with prone position and e-stim+MHP to back and reports elimination of radiating symptoms to left buttocks following prone position.  HEP review reveals increased discomfort to left lumbar rotation and pulling sensation to left buttocks with knee to chest movement.  3/10 LLE pain at end of session. Reports limited ability to tolerate static positions. Encouraged trials of supported prone as a resting position and to self-assess referred symptoms. Continued sessions to advance POC details and reduce pain and prepare for return to work  OBJECTIVE IMPAIRMENTS: Abnormal gait, decreased mobility, difficulty walking, decreased ROM, decreased strength, impaired flexibility, postural dysfunction, and pain.   ACTIVITY LIMITATIONS: lifting, bending, sitting, standing, squatting, sleeping, transfers, bed mobility, locomotion level, and caring for others  PARTICIPATION LIMITATIONS: driving, shopping, community activity, and  occupation  PERSONAL FACTORS: 1-2 comorbidities: see above  are also affecting patient's functional outcome.   REHAB POTENTIAL: Good  CLINICAL DECISION MAKING: Evolving/moderate complexity  EVALUATION COMPLEXITY: Moderate  PLAN:  PT FREQUENCY: 2x/week  PT DURATION: 6 weeks  PLANNED INTERVENTIONS: 97110-Therapeutic exercises, 97530- Therapeutic activity, [id number]- Neuromuscular re-education, 97535- Self Care, 16109- Manual therapy, [id number]- Gait training, 97014- Electrical stimulation (unattended), [id number]- Electrical stimulation (manual), [id number]- Ultrasound, 60454- Traction (mechanical), Patient/Family education, Taping, Dry Needling, Joint mobilization, Cryotherapy, and Moist heat  PLAN FOR NEXT SESSION: Initiate HEP (again and give handouts); try STM for lumbar and piriformis.  Work to decrease peripheral symptoms/centralize symptoms and provide additional HEP exercises as appropriate.  ?Manual traction   Dion Body, PT 03/09/2023, 8:45 AM  [facility] at [facility] [address] [place], [address] [place], [place] [address] Phone # [phone] Fax # [phone]

## 2023-03-12 NOTE — Telephone Encounter (Signed)
Letter sent and patient aware.  

## 2023-03-13 ENCOUNTER — Encounter: Payer: Self-pay | Admitting: Physical Therapy

## 2023-03-13 ENCOUNTER — Ambulatory Visit: Payer: Worker's Compensation | Admitting: Physical Therapy

## 2023-03-13 DIAGNOSIS — M6281 Muscle weakness (generalized): Secondary | ICD-10-CM

## 2023-03-13 DIAGNOSIS — M5459 Other low back pain: Secondary | ICD-10-CM | POA: Diagnosis not present

## 2023-03-13 DIAGNOSIS — R293 Abnormal posture: Secondary | ICD-10-CM

## 2023-03-14 ENCOUNTER — Ambulatory Visit: Payer: Self-pay | Admitting: Family Medicine

## 2023-03-14 NOTE — Telephone Encounter (Signed)
Copied from CRM [id number]. Topic: Clinical - Pink Word Triage >> Mar 14, 2023  9:30 AM Fredrich Romans wrote: Reason for Triage: ongoing back pain

## 2023-03-14 NOTE — Therapy (Signed)
OUTPATIENT PHYSICAL THERAPY NEURO TREATMENT   Patient Name: Matthew Mendoza MRN: [id number] DOB:[birth year], 48 y.o., male Today's Date: 03/15/2023   PCP: Kristian Covey, MD REFERRING PROVIDER: Kristian Covey, MD  END OF SESSION:  PT End of Session - 03/15/23 1521     Visit Number 4    Number of Visits 12    Date for PT Re-Evaluation 04/13/23   corrected   Authorization Type BCBS    PT Start Time 1445    PT Stop Time 1530    PT Time Calculation (min) 45 min    Activity Tolerance Patient limited by pain    Behavior During Therapy Restless               Past Medical History:  Diagnosis Date   Asthma    MRSA carrier    Past Surgical History:  Procedure Laterality Date   I & D EXTREMITY Left 05/01/2018   Procedure: IRRIGATION AND DEBRIDEMENT FINGER;  Surgeon: Bradly Bienenstock, MD;  Location: [facility] OR;  Service: Orthopedics;  Laterality: Left;   There are no active problems to display for this patient.   ONSET DATE: 03/05/2023 (MD referral); pain began 02/27/2023  REFERRING DIAG: S39.012D (ICD-10-CM) - Strain of lumbar region, subsequent encounter   THERAPY DIAG:  Other low back pain  Muscle weakness (generalized)  Abnormal posture  Rationale for Evaluation and Treatment: Rehabilitation  SUBJECTIVE:                                                                                                                                                                                             SUBJECTIVE STATEMENT: Reports that his pain is starting in the L LB radiates to the calf, with "big toe tingling." Feels like he is getting a charlie horse in the calf. Has been working on his exercises. Reports that he has been bending to touch his toes to stretch his back.   Pt accompanied by: self  PERTINENT HISTORY: L lumbar region back pain began 02/27/23, saw Dr. Caryl Never and was started on Naproxen/Flexeril without much reported benefit; occasional tingling L thigh  PAIN:   Are you having pain? Yes: NPRS scale: 2-4/10 Pain location: L low back-left buttocks Pain description: hollow pain, tingling radiates into L thigh and posterior leg into foot  Aggravating factors: standing, walking, increased weight on L foot Relieving factors: sitting; prednisone has helped a little, heating pad/ warm shower  PRECAUTIONS: None  RED FLAGS: Pt does complain tingling into L foot    WEIGHT BEARING RESTRICTIONS: No  FALLS: Has patient fallen in last 6 months? No  LIVING ENVIRONMENT:  Lives with: lives with their family Lives in: House/apartment Stairs: 2 steps onto deck Has following equipment at home: None  PLOF: Independent and Vocation/Vocational requirements: pushing, lifting, getting in and out of car multiple times  PATIENT GOALS: To get back to normal mobility and get rid of pain  OBJECTIVE:     TODAY'S TREATMENT: 03/15/23 Activity Comments  R/L runner's stretch 2x30" Tolerated well  Sitting prayer stretch with green pball 5x5" To tolerance/limited ROM; improvement in ROM by last rep. Pt cannot determine if this is a relieving or aggravating position.  Standing repeated lumbar extension 10x  Reports pain when coming back to midline   Prone on elbows  Tendency to guard and avoiding spinal extension; cues to relax and perform small ROM but without compensations. Pt reports pain in LB rather than down the LE. However reports increased pain after getting up   Demo and practice using ball on wall self-STM to L LB and buttock Pt reports "that actually doesn't feel bad."  STS + green medball 2x10 To elevated seat; good tolerance   standing hip ABD 10x each side  Cues to relax shoulders, look straight ahead, avoid excessive forward lean on UEs. Reports cramping in the L buttock after attempting on L  nustep with MHP to LB L4x 6 min UEs/LEs  Better tolerance compared to last session            PATIENT EDUCATION: Education details: edu and handouts on TENS unit,  precautions, contraindications, safety and use.  Person educated: Patient Education method: Explanation, Tactile cues, Verbal cues, and Handouts Education comprehension: verbalized understanding and returned demonstration   HOME EXERCISE PROGRAM Last updated: 03/13/23 Access Code: [email or web address] URL: [email or web address] Date: 03/13/2023 Prepared by: [facility] - Outpatient  Rehab - [facility]  Exercises - Supine Lower Trunk Rotation  - 1 x daily - 7 x weekly - 3 sets - 5 reps - Supine Posterior Pelvic Tilt  - 1 x daily - 7 x weekly - 1-2 sets - 10 reps - Seated Pelvic Tilts  - 1 x daily - 7 x weekly - 1-2 sets - 10 reps - Hooklying Single Knee to Chest Stretch  - 1 x daily - 7 x weekly - 1 sets - 5 reps - 15-30 sec hold - Supine Sciatic Nerve Glide  - 1 x daily - 5 x weekly - 2 sets - 20 reps - Supine Figure 4 Piriformis Stretch  - 1 x daily - 5 x weekly - 2 sets - 30 reps      Note: Objective measures were completed at Evaluation unless otherwise noted.  DIAGNOSTIC FINDINGS: NA  COGNITION: Overall cognitive status: Within functional limits for tasks assessed   SENSATION: Light touch: Impaired  and feels lighter touch on LLE, especially distally  MUSCLE LENGTH:  SLR:  LLE 23 degrees, very painful in L lumbar area Attempted 90/90 hamstring measure, unable due to L lumbar pain  POSTURE: rounded shoulders, forward head, and very guarded/antalgic posture; decreased weightshift to LLE in sitting  Lumbar ROM: Flexion 20 degrees Extension 10 degrees  Repeated flexion:  pain more into L shoulder (incr to 6/10 from 5/10) Repeated extension pain into thoracic spine and into L leg (5/10)  Palpation:  Pt very tender to palpation L lumbar musculature, paraspinals, L4-L5 facet area.  Tender to palpation at L buttocks and piriformis  LOWER EXTREMITY ROM Seated hamstring motion limited and guarded LLE  LOWER EXTREMITY MMT:    MMT Right Eval Left Eval  Hip  flexion 4+ 3+ pain  Hip extension    Hip abduction    Hip adduction    Hip internal rotation    Hip external rotation    Knee flexion 4 3+ pain  Knee extension 3+ 3+  Ankle dorsiflexion 4 4  Ankle plantarflexion    Ankle inversion    Ankle eversion    (Blank rows = not tested)  TRANSFERS: Assistive device utilized:  BUE support, slowed, guarded, decreased LLE weightbearing and None  Sit to stand: Modified independence Stand to sit: Modified independence  GAIT: Gait pattern: step through pattern and antalgic Distance walked: 40 ft Assistive device utilized: None Level of assistance: Modified independence Comments: slowed, guarded, antalgic pattern   PATIENT SURVEYS:  Modified Oswestry Score = 30; 60% (41%-60% indicates severe disability)                                                                                                                                 GOALS: Goals reviewed with patient? Yes  SHORT TERM GOALS: Target date: 03/23/2023  Pt will be independent with HEP for improved pain, strength, flexibility. Baseline: Goal status: IN PROGRESS  2.  Pt will report decrease in pain with standing activities by 50%. Baseline: can go up to 10/10 Goal status: IN PROGRESS   LONG TERM GOALS: Target date: 04/13/2023  Pt will be independent with progression of HEP for improved pain, flexibility, strength, mobility. Baseline:  Goal status: IN PROGRESS  2.  Pt will improve lumbar ROM to [facility], for improved painfree ROM. Baseline: See above Goal status: IN PROGRESS  3.  Pt will verbalize posture/positioning/body mechanics for sleep, work, home activities to lessen pain. Baseline:  Goal status: IN PROGRESS  4.  Modified Oswestry score to improve to 40% or less, to indicate decreased disability level due to back pain. Baseline: 60% Goal status: IN PROGRESS  ASSESSMENT:  CLINICAL IMPRESSION: Patient arrived to session with report of noticing radiation of pain  from the L LB to the calf with tingling in the L great toe. Continued to encourage patient to move within tolerance. Gentle stretching was progressed to address areas of pain. Trialed lumbar extension in standing and prone with seemingly some centralization of pain however reported increased pain after getting up. Challenging to assess directional preference based on patient's reports. Patient with limited tolerance for session's activities d/t pain.   OBJECTIVE IMPAIRMENTS: Abnormal gait, decreased mobility, difficulty walking, decreased ROM, decreased strength, impaired flexibility, postural dysfunction, and pain.   ACTIVITY LIMITATIONS: lifting, bending, sitting, standing, squatting, sleeping, transfers, bed mobility, locomotion level, and caring for others  PARTICIPATION LIMITATIONS: driving, shopping, community activity, and occupation  PERSONAL FACTORS: 1-2 comorbidities: see above  are also affecting patient's functional outcome.   REHAB POTENTIAL: Good  CLINICAL DECISION MAKING: Evolving/moderate complexity  EVALUATION COMPLEXITY: Moderate  PLAN:  PT FREQUENCY: 2x/week  PT DURATION: 6 weeks  PLANNED INTERVENTIONS: 97110-Therapeutic exercises, 97530- Therapeutic  activity, [id number]- Neuromuscular re-education, [id number]- Self Care, 60454- Manual therapy, [id number]- Gait training, 97014- Electrical stimulation (unattended), [id number]- Electrical stimulation (manual), [id number]- Ultrasound, [id number]- Traction (mechanical), Patient/Family education, Taping, Dry Needling, Joint mobilization, Cryotherapy, and Moist heat  PLAN FOR NEXT SESSION: review HEP; try STM for lumbar and piriformis.  Work to decrease peripheral symptoms/centralize symptoms and provide additional HEP exercises as appropriate.  ?Manual traction    Baldemar Friday, PT, DPT 03/15/23 3:33 PM  [facility] Re[facility] at [facility] [address] [place], [address] [place], [place] [address] Phone # [phone] Fax #  [phone]

## 2023-03-14 NOTE — Telephone Encounter (Signed)
This patient was calling in to schedule an appointment on unresolved back pain that has been treated and evaluated by Dr. Caryl Never. Patient's attorney advised him to follow up with provider to ensure he gets re-evaluated and has proper documentation.   Reason for Disposition  [1] MODERATE back pain (e.g., interferes with normal activities) AND [2] present > 3 days  Answer Assessment - Initial Assessment Questions 1. ONSET: "When did the pain begin?"       On January 7th  2. LOCATION: "Where does it hurt?" (upper, mid or lower back)     Mid to lower left back 3. SEVERITY: "How bad is the pain?"  (e.g., Scale 1-10; mild, moderate, or severe)   - MILD (1-3): Doesn't interfere with normal activities.    - MODERATE (4-7): Interferes with normal activities or awakens from sleep.    - SEVERE (8-10): Excruciating pain, unable to do any normal activities.      Fluctuates from 0-10 4. PATTERN: "Is the pain constant?" (e.g., yes, no; constant, intermittent)      Intermittent 5. RADIATION: "Does the pain shoot into your legs or somewhere else?"     Runs down legs and hip, feel it in pelvis area 6. CAUSE:  "What do you think is causing the back pain?"      Accident at work on the 7th of January  8. MEDICINES: "What have you taken so far for the pain?" (e.g., nothing, acetaminophen, NSAIDS)     Naproxen and flexeril 9. NEUROLOGIC SYMPTOMS: "Do you have any weakness, numbness, or problems with bowel/bladder control?"     Tingling all the way down to big toe 10. OTHER SYMPTOMS: "Do you have any other symptoms?" (e.g., fever, abdomen pain, burning with urination, blood in urine)       Pelvis pain  Protocols used: Back Pain-A-AH

## 2023-03-15 ENCOUNTER — Ambulatory Visit: Payer: Worker's Compensation | Admitting: Physical Therapy

## 2023-03-15 ENCOUNTER — Encounter: Payer: Self-pay | Admitting: Physical Therapy

## 2023-03-15 DIAGNOSIS — R293 Abnormal posture: Secondary | ICD-10-CM

## 2023-03-15 DIAGNOSIS — M5459 Other low back pain: Secondary | ICD-10-CM

## 2023-03-15 DIAGNOSIS — M6281 Muscle weakness (generalized): Secondary | ICD-10-CM

## 2023-03-15 NOTE — Patient Instructions (Signed)
TENS stands for Transcutaneous Electrical Nerve Stimulation. In other words, electrical impulses are allowed to pass through the skin in order to excite a nerve.   Purpose and Use of TENS:  TENS is a method used to manage acute and chronic pain without the use of drugs. It has been effective in managing pain associated with surgery, sprains, strains, trauma, rheumatoid arthritis, and neuralgias. It is a non-addictive, low risk, and non-invasive technique used to control pain. It is not, by any means, a curative form of treatment.   How TENS Works:  Most TENS units are a small pocket-sized unit powered by one 9 volt battery. Attached to the outside of the unit are two lead wires where two pins and/or snaps connect on each wire. All units come with a set of four reusable pads or electrodes. These are placed on the skin surrounding the area involved. By inserting the leads into  the pads, the electricity can pass from the unit making the circuit complete.  As the intensity is turned up slowly, the electrical current enters the body from the electrodes through the skin to the surrounding nerve fibers. This triggers the release of hormones from within the body. These hormones contain pain relievers. By increasing the circulation of these hormones, the person's pain may be lessened. It is also believed that the electrical stimulation itself helps to block the pain messages being sent to the brain, thus also decreasing the body's perception of pain.   Hazards:  TENS units are NOT to be used by patients with PACEMAKERS, DEFIBRILLATORS, DIABETIC PUMPS, PREGNANT WOMEN, and patients with SEIZURE DISORDERS.  TENS units are NOT to be used over the heart, throat, brain, or spinal cord.  One of the major side effects from the TENS unit may be skin irritation. Some people may develop a rash if they are sensitive to the materials used in the electrodes or the connecting wires.   Wear the unit for 15 minutes.   Avoid  overuse due the body getting used to the stem making it not as effective over time.     TENS UNIT  This is helpful for muscle pain and spasm.   Search and Purchase a TENS 7000 2nd edition at www.tenspros.com or www.amazon.com  (It should be less than $30)     TENS unit instructions:   Do not shower or bathe with the unit on  Turn the unit off before removing electrodes or batteries  If the electrodes lose stickiness add a drop of water to the electrodes after they are disconnected from the unit and place on plastic sheet. If you continued to have difficulty, call the TENS unit company to purchase more electrodes.  Do not apply lotion on the skin area prior to use. Make sure the skin is clean and dry as this will help prolong the life of the electrodes.  After use, always check skin for unusual red areas, rash or other skin difficulties. If there are any skin problems, does not apply electrodes to the same area.  Never remove the electrodes from the unit by pulling the wires.  Do not use the TENS unit or electrodes other than as directed.  Do not change electrode placement without consulting your therapist or physician.  Keep 2 fingers with between each electrode.       

## 2023-03-16 ENCOUNTER — Ambulatory Visit (INDEPENDENT_AMBULATORY_CARE_PROVIDER_SITE_OTHER): Payer: BC Managed Care – PPO

## 2023-03-16 ENCOUNTER — Ambulatory Visit (INDEPENDENT_AMBULATORY_CARE_PROVIDER_SITE_OTHER): Payer: BC Managed Care – PPO | Admitting: Family Medicine

## 2023-03-16 ENCOUNTER — Encounter: Payer: Self-pay | Admitting: Family Medicine

## 2023-03-16 VITALS — BP 130/80 | HR 73 | Temp 98.1°F | Wt 213.8 lb

## 2023-03-16 DIAGNOSIS — M5416 Radiculopathy, lumbar region: Secondary | ICD-10-CM | POA: Diagnosis not present

## 2023-03-16 MED ORDER — CYCLOBENZAPRINE HCL 10 MG PO TABS: 10.0000 mg | ORAL_TABLET | Freq: Three times a day (TID) | ORAL | 0 refills | Status: AC | PRN

## 2023-03-16 MED ORDER — PREDNISONE 20 MG PO TABS: ORAL_TABLET | ORAL | 0 refills | Status: DC

## 2023-03-16 NOTE — Patient Instructions (Signed)
I will set up MRI to further assess low back pain.

## 2023-03-16 NOTE — Progress Notes (Unsigned)
Established Patient Office Visit  Subjective   Patient ID: Matthew Mendoza, male    DOB: 26-Jul-1975  Age: 47 y.o. MRN: [id number]  No chief complaint on file.   HPI  {History (Optional):23778} Matthew Mendoza seen for follow-up regarding ongoing back pain.  If anything this is worse.  Having pain left lower lumbar region with radiculitis symptoms down the left lower extremity and intermittent tingling especially his foot and ankle region.  His first visit here was January 7 which was his day of injury.  He was pulling on a filing cabinet in the back of his vehicle when he felt a popping sensation lower back region.  Came in that day for further evaluation.  Was placed on naproxen and Flexeril with minimal relief.  Work note was written for couple days.  He was seen then back in follow-up on the 13th and at that point we prescribed 6 days of prednisone 40 mg daily.  He did see some improvement of pain with the prednisone.  We initiated physical therapy and has been off 4 times.  Has been doing some frequent stretching.  We extend his work out another week on the 13th.  He continues to have severe pain at times.  Has had some constipation but no urine or stool incontinence.  Pain radiates frequently into the left lower extremity all the way down to the foot.  He has significant pain with prolonged periods of sitting but also standing.  Pain worse with bending. Denies any past history of significant back difficulties.  Past Medical History:  Diagnosis Date   Asthma    MRSA carrier    Past Surgical History:  Procedure Laterality Date   I & D EXTREMITY Left 05/01/2018   Procedure: IRRIGATION AND DEBRIDEMENT FINGER;  Surgeon: Bradly Bienenstock, MD;  Location: [facility] OR;  Service: Orthopedics;  Laterality: Left;    reports that he has been smoking cigarettes. He has never used smokeless tobacco. He reports that he does not drink alcohol and does not use drugs. family history includes Depression in his mother. No  Known Allergies  Review of Systems  Constitutional:  Negative for chills and fever.  Gastrointestinal:  Negative for abdominal pain.  Musculoskeletal:  Positive for back pain.  Neurological:  Positive for tingling. Negative for focal weakness.      Objective:     BP 130/80 (BP Location: Left Arm, Patient Position: Sitting, Cuff Size: Normal)   Pulse 73   Temp 98.1 F (36.7 C) (Oral)   Wt 213 lb 12.8 oz (97 kg)   SpO2 99%   BMI 30.68 kg/m  BP Readings from Last 3 Encounters:  03/16/23 130/80  03/05/23 116/70  02/27/23 (!) 140/80   Wt Readings from Last 3 Encounters:  03/16/23 213 lb 12.8 oz (97 kg)  03/05/23 221 lb 3.2 oz (100.3 kg)  02/27/23 218 lb 6.4 oz (99.1 kg)      Physical Exam Vitals reviewed.  Constitutional:      General: He is not in acute distress.    Appearance: He is not ill-appearing.  Cardiovascular:     Rate and Rhythm: Normal rate and regular rhythm.  Pulmonary:     Effort: Pulmonary effort is normal.     Breath sounds: Normal breath sounds.  Musculoskeletal:     Comments: Straight leg raises are negative bilaterally.  Neurological:     Mental Status: He is alert.     Comments: He has good strength with plantarflexion and dorsiflexion bilaterally.  2+ knee reflexes bilaterally.  1+ right ankle and trace left ankle.      No results found for any visits on 03/16/23.  {Labs (Optional):23779}  The ASCVD Risk score (Arnett DK, et al., 2019) failed to calculate for the following reasons:   Cannot find a previous HDL lab   Cannot find a previous total cholesterol lab    Assessment & Plan:   Problem List Items Addressed This Visit   None Visit Diagnoses       Left lumbar radiculitis    -  Primary   Relevant Medications   cyclobenzaprine (FLEXERIL) 10 MG tablet   Other Relevant Orders   DG Lumbar Spine Complete     Patient presents with back injury on 1 - 7 - 25 at work after pulling on a  drawer that was difficult open.  He had some  progressive left lumbar radiculitis symptoms that since then.  Minimal improvement with prednisone.  Negligible improvement with naproxen.  He is using Flexeril at night.  Currently undergoing physical therapy.  He brings in paperwork today regarding his recent work absence.  Concern is whether he may have herniated disc with his current symptoms.  Does not have any significant weakness at this time but does have slightly diminished left ankle reflex compared to the right.  -Start with plain films lumbar spine -Refill Flexeril for as needed use at night.  We did discuss that this could be constipating -Decided to do 1 more prednisone taper since this did provide some temporary relief -Extend work absence another week. -Complete paperwork -Set up MRI lumbar spine to further assess -  No follow-ups on file.    Evelena Peat, MD

## 2023-03-20 ENCOUNTER — Ambulatory Visit: Payer: Worker's Compensation

## 2023-03-20 ENCOUNTER — Telehealth: Payer: Self-pay

## 2023-03-20 DIAGNOSIS — R293 Abnormal posture: Secondary | ICD-10-CM

## 2023-03-20 DIAGNOSIS — M5459 Other low back pain: Secondary | ICD-10-CM | POA: Diagnosis not present

## 2023-03-20 DIAGNOSIS — M6281 Muscle weakness (generalized): Secondary | ICD-10-CM

## 2023-03-20 NOTE — Telephone Encounter (Signed)
Copied from CRM [id number]. Topic: Clinical - Lab/Test Results >> Mar 20, 2023  9:53 AM [name] wrote: Reason for CRM: Pt would like for someone to follow up with him regarding his back X-ray

## 2023-03-20 NOTE — Addendum Note (Signed)
Addended by: Kristian Covey on: 03/20/2023 08:52 PM   Modules accepted: Orders

## 2023-03-20 NOTE — Therapy (Signed)
OUTPATIENT PHYSICAL THERAPY NEURO TREATMENT   Patient Name: Matthew Mendoza MRN: [id number] DOB:1975/08/10, 48 y.o., male Today's Date: 03/20/2023   PCP: Kristian Covey, MD REFERRING PROVIDER: Kristian Covey, MD  END OF SESSION:  PT End of Session - 03/20/23 [phone]     Visit Number 5    Number of Visits 12    Date for PT Re-Evaluation 04/13/23   corrected   Authorization Type [employer]    PT Start Time [phone]    PT Stop Time 0930    PT Time Calculation (min) 38 min    Activity Tolerance Patient limited by pain    Behavior During Therapy Restless               Past Medical History:  Diagnosis Date   Asthma    MRSA carrier    Past Surgical History:  Procedure Laterality Date   I & D EXTREMITY Left 05/01/2018   Procedure: IRRIGATION AND DEBRIDEMENT FINGER;  Surgeon: Bradly Bienenstock, MD;  Location: [facility] OR;  Service: Orthopedics;  Laterality: Left;   There are no active problems to display for this patient.   ONSET DATE: 03/05/2023 (MD referral); pain began 02/27/2023  REFERRING DIAG: S39.012D (ICD-10-CM) - Strain of lumbar region, subsequent encounter   THERAPY DIAG:  Other low back pain  Muscle weakness (generalized)  Abnormal posture  Rationale for Evaluation and Treatment: Rehabilitation  SUBJECTIVE:                                                                                                                                                                                             SUBJECTIVE STATEMENT: Started back on steroids.  Noting the symptoms extend from back and into the LLE into the calf.  Becomes more prominent when sitting/driving.  Some relief once arising and walking around.     Pt accompanied by: self  PERTINENT HISTORY: L lumbar region back pain began 02/27/23, saw Dr. Caryl Never and was started on Naproxen/Flexeril without much reported benefit; occasional tingling L thigh  PAIN:  Are you having pain? Yes: NPRS scale: 5-6/10 Pain location: L  low back-left buttocks, calf tight Pain description: hollow pain, tingling radiates into L thigh and posterior leg into foot  Aggravating factors: standing, walking, increased weight on L foot Relieving factors: sitting; prednisone has helped a little, heating pad/ warm shower  PRECAUTIONS: None  RED FLAGS: Pt does complain tingling into L foot    WEIGHT BEARING RESTRICTIONS: No  FALLS: Has patient fallen in last 6 months? No  LIVING ENVIRONMENT: Lives with: lives with their family Lives in: House/apartment Stairs: 2 steps onto  deck Has following equipment at home: None  PLOF: Independent and Vocation/Vocational requirements: pushing, lifting, getting in and out of car multiple times  PATIENT GOALS: To get back to normal mobility and get rid of pain  OBJECTIVE:   TODAY'S TREATMENT: 03/20/23 Activity Comments  TENS unit set-up Pt personal device Demo of pad placement and relevant parameters for pain modulation and settings for device  Supported prone x 2 min Prone on elbows 2x10 Seated lumbar extensions 2x10 against lumbar roll   Sitting posture Education on lumbar roll and support for lordosis.  Education on body mechanics and information regarding peripheralization vs centralization of symptoms in LLE as means for self-assessment                TODAY'S TREATMENT: 03/15/23 Activity Comments  R/L runner's stretch 2x30" Tolerated well  Sitting prayer stretch with green pball 5x5" To tolerance/limited ROM; improvement in ROM by last rep. Pt cannot determine if this is a relieving or aggravating position.  Standing repeated lumbar extension 10x  Reports pain when coming back to midline   Prone on elbows  Tendency to guard and avoiding spinal extension; cues to relax and perform small ROM but without compensations. Pt reports pain in LB rather than down the LE. However reports increased pain after getting up   Demo and practice using ball on wall self-STM to L LB and buttock  Pt reports "that actually doesn't feel bad."  STS + green medball 2x10 To elevated seat; good tolerance   standing hip ABD 10x each side  Cues to relax shoulders, look straight ahead, avoid excessive forward lean on UEs. Reports cramping in the L buttock after attempting on L  nustep with MHP to LB L4x 6 min UEs/LEs  Better tolerance compared to last session            PATIENT EDUCATION: Education details: edu and handouts on TENS unit, precautions, contraindications, safety and use.  Person educated: Patient Education method: Explanation, Tactile cues, Verbal cues, and Handouts Education comprehension: verbalized understanding and returned demonstration   HOME EXERCISE PROGRAM Last updated: 03/13/23 Access Code: [email or web address] URL: [email or web address] Date: 03/13/2023 Prepared by: [facility] - Outpatient  Rehab - [facility]  Exercises - Supine Lower Trunk Rotation  - 1 x daily - 7 x weekly - 3 sets - 5 reps - Supine Posterior Pelvic Tilt  - 1 x daily - 7 x weekly - 1-2 sets - 10 reps - Seated Pelvic Tilts  - 1 x daily - 7 x weekly - 1-2 sets - 10 reps - Hooklying Single Knee to Chest Stretch  - 1 x daily - 7 x weekly - 1 sets - 5 reps - 15-30 sec hold - Supine Sciatic Nerve Glide  - 1 x daily - 5 x weekly - 2 sets - 20 reps - Supine Figure 4 Piriformis Stretch  - 1 x daily - 5 x weekly - 2 sets - 30 reps - Prone Press Up On Elbows  - 1 x daily - 7 x weekly - 2 sets - 10 reps      Note: Objective measures were completed at Evaluation unless otherwise noted.  DIAGNOSTIC FINDINGS: NA  COGNITION: Overall cognitive status: Within functional limits for tasks assessed   SENSATION: Light touch: Impaired  and feels lighter touch on LLE, especially distally  MUSCLE LENGTH:  SLR:  LLE 23 degrees, very painful in L lumbar area Attempted 90/90 hamstring measure, unable due to L lumbar pain  POSTURE:  rounded shoulders, forward head, and very guarded/antalgic  posture; decreased weightshift to LLE in sitting  Lumbar ROM: Flexion 20 degrees Extension 10 degrees  Repeated flexion:  pain more into L shoulder (incr to 6/10 from 5/10) Repeated extension pain into thoracic spine and into L leg (5/10)  Palpation:  Pt very tender to palpation L lumbar musculature, paraspinals, L4-L5 facet area.  Tender to palpation at L buttocks and piriformis  LOWER EXTREMITY ROM Seated hamstring motion limited and guarded LLE  LOWER EXTREMITY MMT:    MMT Right Eval Left Eval  Hip flexion 4+ 3+ pain  Hip extension    Hip abduction    Hip adduction    Hip internal rotation    Hip external rotation    Knee flexion 4 3+ pain  Knee extension 3+ 3+  Ankle dorsiflexion 4 4  Ankle plantarflexion    Ankle inversion    Ankle eversion    (Blank rows = not tested)  TRANSFERS: Assistive device utilized:  BUE support, slowed, guarded, decreased LLE weightbearing and None  Sit to stand: Modified independence Stand to sit: Modified independence  GAIT: Gait pattern: step through pattern and antalgic Distance walked: 40 ft Assistive device utilized: None Level of assistance: Modified independence Comments: slowed, guarded, antalgic pattern   PATIENT SURVEYS:  Modified Oswestry Score = 30; 60% (41%-60% indicates severe disability)                                                                                                                                 GOALS: Goals reviewed with patient? Yes  SHORT TERM GOALS: Target date: 03/23/2023  Pt will be independent with HEP for improved pain, strength, flexibility. Baseline: Goal status: IN PROGRESS  2.  Pt will report decrease in pain with standing activities by 50%. Baseline: can go up to 10/10 Goal status: IN PROGRESS   LONG TERM GOALS: Target date: 04/13/2023  Pt will be independent with progression of HEP for improved pain, flexibility, strength, mobility. Baseline:  Goal status: IN  PROGRESS  2.  Pt will improve lumbar ROM to [facility], for improved painfree ROM. Baseline: See above Goal status: IN PROGRESS  3.  Pt will verbalize posture/positioning/body mechanics for sleep, work, home activities to lessen pain. Baseline:  Goal status: IN PROGRESS  4.  Modified Oswestry score to improve to 40% or less, to indicate decreased disability level due to back pain. Baseline: 60% Goal status: IN PROGRESS  ASSESSMENT:  CLINICAL IMPRESSION: Pt reports ongoing issues of LLE symptoms extending to calf and big toe and notes this is more obvious with prolonged sitting of when bearing all of his weight on LLE.  Pt purchased portable TENS for home use and instructed in pad placement and relevant settings for pain control with good teach-back.  Instructed in prone position activities to determine of this achieves centralization of symptoms.  Pt education on support of lumbar lordosis and instructed in techniques  for positioning and body mechanics to see if this avoids peripherlization of symptoms, verbalizes understanding.  Continued sessions indicated to advance POC details to reduce back and LLE pain to improve mobility and activity tolerance for return to typical activity.  OBJECTIVE IMPAIRMENTS: Abnormal gait, decreased mobility, difficulty walking, decreased ROM, decreased strength, impaired flexibility, postural dysfunction, and pain.   ACTIVITY LIMITATIONS: lifting, bending, sitting, standing, squatting, sleeping, transfers, bed mobility, locomotion level, and caring for others  PARTICIPATION LIMITATIONS: driving, shopping, community activity, and occupation  PERSONAL FACTORS: 1-2 comorbidities: see above  are also affecting patient's functional outcome.   REHAB POTENTIAL: Good  CLINICAL DECISION MAKING: Evolving/moderate complexity  EVALUATION COMPLEXITY: Moderate  PLAN:  PT FREQUENCY: 2x/week  PT DURATION: 6 weeks  PLANNED INTERVENTIONS: 97110-Therapeutic exercises,  97530- Therapeutic activity, [id number]- Neuromuscular re-education, 97535- Self Care, 40981- Manual therapy, [id number]- Gait training, 97014- Electrical stimulation (unattended), [id number]- Electrical stimulation (manual), [id number]- Ultrasound, 19147- Traction (mechanical), Patient/Family education, Taping, Dry Needling, Joint mobilization, Cryotherapy, and Moist heat  PLAN FOR NEXT SESSION: review HEP; try STM for lumbar and piriformis.  Work to decrease peripheral symptoms/centralize symptoms and provide additional HEP exercises as appropriate.  ?Manual traction   10:32 AM, 03/20/23 M. Shary Decamp, PT, DPT Physical Therapist- [place] Office Number: [phone]

## 2023-03-20 NOTE — Telephone Encounter (Signed)
Called Reading room to have X-Ray read

## 2023-03-21 ENCOUNTER — Encounter: Payer: Self-pay | Admitting: Family Medicine

## 2023-03-21 NOTE — Therapy (Signed)
OUTPATIENT PHYSICAL THERAPY NEURO TREATMENT   Patient Name: Matthew Mendoza MRN: [id number] DOB:[birth year], 48 y.o., male Today's Date: 03/22/2023   PCP: Kristian Covey, MD REFERRING PROVIDER: Kristian Covey, MD  END OF SESSION:  PT End of Session - 03/22/23 1411     Visit Number 6    Number of Visits 12    Date for PT Re-Evaluation 04/13/23    Authorization Type BCBS    PT Start Time 1359    PT Stop Time 1439    PT Time Calculation (min) 40 min    Activity Tolerance Patient limited by pain    Behavior During Therapy Restless                Past Medical History:  Diagnosis Date   Asthma    MRSA carrier    Past Surgical History:  Procedure Laterality Date   I & D EXTREMITY Left 05/01/2018   Procedure: IRRIGATION AND DEBRIDEMENT FINGER;  Surgeon: Bradly Bienenstock, MD;  Location: [facility] OR;  Service: Orthopedics;  Laterality: Left;   There are no active problems to display for this patient.   ONSET DATE: 03/05/2023 (MD referral); pain began 02/27/2023  REFERRING DIAG: S39.012D (ICD-10-CM) - Strain of lumbar region, subsequent encounter   THERAPY DIAG:  Other low back pain  Muscle weakness (generalized)  Abnormal posture  Rationale for Evaluation and Treatment: Rehabilitation  SUBJECTIVE:                                                                                                                                                                                             SUBJECTIVE STATEMENT: Having some pain in the L leg today, not so much in the back.    Pt accompanied by: self  PERTINENT HISTORY: L lumbar region back pain began 02/27/23, saw Dr. Caryl Never and was started on Naproxen/Flexeril without much reported benefit; occasional tingling L thigh  PAIN:  Are you having pain? Yes: NPRS scale: 6/10 Pain location: L LE Pain description: hollow pain, tingling radiates into L thigh and posterior leg into foot  Aggravating factors: standing, walking,  increased weight on L foot Relieving factors: sitting; prednisone has helped a little, heating pad/ warm shower  PRECAUTIONS: None  RED FLAGS: Pt does complain tingling into L foot    WEIGHT BEARING RESTRICTIONS: No  FALLS: Has patient fallen in last 6 months? No  LIVING ENVIRONMENT: Lives with: lives with their family Lives in: House/apartment Stairs: 2 steps onto deck Has following equipment at home: None  PLOF: Independent and Vocation/Vocational requirements: pushing, lifting, getting in and out of car multiple times  PATIENT GOALS: To get back to normal mobility and get rid of pain  OBJECTIVE:     TODAY'S TREATMENT: 03/22/23 Activity Comments  Nustep L2 x 6 min UEs/LEs  Had to stop and stand at 2.5 min d/t L LE pain   prone on elbows 10x3" Instructed to perform to tolerance; guarding   open book stretch 10x each  Initially pt just moving  arm, required cueing to encourage gentle spinal rotation to tolerance. Used hand behind head for best tolerance. Limited tolerance for L sidelying   Green medball STS to mat 2x10  Good tolerance   standing HS curl #5 10x each LE Leaning on TM rail; able to tolerate R better than L. Divided L side into 2x5   Sitting LAQ 5# 2x10 each  Unable to tolerate with # on L LE  Sitting KTOS and fig 4 stretch 30" each  Reports pain in L HS when stretching R hip     PATIENT EDUCATION: Education details: answered pt's questions on degenerative disc disease; edu on fear avoidance activities  Person educated: Patient Education method: Explanation Education comprehension: verbalized understanding    HOME EXERCISE PROGRAM Last updated: 03/13/23 Access Code: [email or web address] URL: [email or web address] Date: 03/13/2023 Prepared by: [facility] - Outpatient  Rehab - [facility]  Exercises - Supine Lower Trunk Rotation  - 1 x daily - 7 x weekly - 3 sets - 5 reps - Supine Posterior Pelvic Tilt  - 1 x daily - 7 x weekly - 1-2 sets - 10  reps - Seated Pelvic Tilts  - 1 x daily - 7 x weekly - 1-2 sets - 10 reps - Hooklying Single Knee to Chest Stretch  - 1 x daily - 7 x weekly - 1 sets - 5 reps - 15-30 sec hold - Supine Sciatic Nerve Glide  - 1 x daily - 5 x weekly - 2 sets - 20 reps - Supine Figure 4 Piriformis Stretch  - 1 x daily - 5 x weekly - 2 sets - 30 reps - Prone Press Up On Elbows  - 1 x daily - 7 x weekly - 2 sets - 10 reps      Note: Objective measures were completed at Evaluation unless otherwise noted.  DIAGNOSTIC FINDINGS: NA  COGNITION: Overall cognitive status: Within functional limits for tasks assessed   SENSATION: Light touch: Impaired  and feels lighter touch on LLE, especially distally  MUSCLE LENGTH:  SLR:  LLE 23 degrees, very painful in L lumbar area Attempted 90/90 hamstring measure, unable due to L lumbar pain  POSTURE: rounded shoulders, forward head, and very guarded/antalgic posture; decreased weightshift to LLE in sitting  Lumbar ROM: Flexion 20 degrees Extension 10 degrees  Repeated flexion:  pain more into L shoulder (incr to 6/10 from 5/10) Repeated extension pain into thoracic spine and into L leg (5/10)  Palpation:  Pt very tender to palpation L lumbar musculature, paraspinals, L4-L5 facet area.  Tender to palpation at L buttocks and piriformis  LOWER EXTREMITY ROM Seated hamstring motion limited and guarded LLE  LOWER EXTREMITY MMT:    MMT Right Eval Left Eval  Hip flexion 4+ 3+ pain  Hip extension    Hip abduction    Hip adduction    Hip internal rotation    Hip external rotation    Knee flexion 4 3+ pain  Knee extension 3+ 3+  Ankle dorsiflexion 4 4  Ankle plantarflexion    Ankle inversion    Ankle eversion    (  Blank rows = not tested)  TRANSFERS: Assistive device utilized:  BUE support, slowed, guarded, decreased LLE weightbearing and None  Sit to stand: Modified independence Stand to sit: Modified independence  GAIT: Gait pattern: step through  pattern and antalgic Distance walked: 40 ft Assistive device utilized: None Level of assistance: Modified independence Comments: slowed, guarded, antalgic pattern   PATIENT SURVEYS:  Modified Oswestry Score = 30; 60% (41%-60% indicates severe disability)                                                                                                                                 GOALS: Goals reviewed with patient? Yes  SHORT TERM GOALS: Target date: 03/23/2023  Pt will be independent with HEP for improved pain, strength, flexibility. Baseline: Goal status: IN PROGRESS  2.  Pt will report decrease in pain with standing activities by 50%. Baseline: can go up to 10/10 Goal status: IN PROGRESS   LONG TERM GOALS: Target date: 04/13/2023  Pt will be independent with progression of HEP for improved pain, flexibility, strength, mobility. Baseline:  Goal status: IN PROGRESS  2.  Pt will improve lumbar ROM to [facility], for improved painfree ROM. Baseline: See above Goal status: IN PROGRESS  3.  Pt will verbalize posture/positioning/body mechanics for sleep, work, home activities to lessen pain. Baseline:  Goal status: IN PROGRESS  4.  Modified Oswestry score to improve to 40% or less, to indicate decreased disability level due to back pain. Baseline: 60% Goal status: IN PROGRESS  ASSESSMENT:  CLINICAL IMPRESSION: Patient arrived to session with report of increased L LE pain today. Performed gentle spinal mobility exercises however patient still with muscle guarding limiting considerably. Also limited tolerance for L sidelying d/t pain. LE strengthening was limited as patient could not tolerate use of light ankle weights with standing or seated activities. Patient with poor tolerance for therapy today; continue to educate on fear avoidance as patient is very fixated on his pain throughout sessions despite proving multiple modifications.   OBJECTIVE IMPAIRMENTS: Abnormal gait,  decreased mobility, difficulty walking, decreased ROM, decreased strength, impaired flexibility, postural dysfunction, and pain.   ACTIVITY LIMITATIONS: lifting, bending, sitting, standing, squatting, sleeping, transfers, bed mobility, locomotion level, and caring for others  PARTICIPATION LIMITATIONS: driving, shopping, community activity, and occupation  PERSONAL FACTORS: 1-2 comorbidities: see above  are also affecting patient's functional outcome.   REHAB POTENTIAL: Good  CLINICAL DECISION MAKING: Evolving/moderate complexity  EVALUATION COMPLEXITY: Moderate  PLAN:  PT FREQUENCY: 2x/week  PT DURATION: 6 weeks  PLANNED INTERVENTIONS: 97110-Therapeutic exercises, 97530- Therapeutic activity, [id number]- Neuromuscular re-education, 97535- Self Care, 14782- Manual therapy, [id number]- Gait training, 97014- Electrical stimulation (unattended), [id number]- Electrical stimulation (manual), [id number]- Ultrasound, 95621- Traction (mechanical), Patient/Family education, Taping, Dry Needling, Joint mobilization, Cryotherapy, and Moist heat  PLAN FOR NEXT SESSION: review HEP; try STM for lumbar and piriformis.  Work to decrease peripheral symptoms/centralize symptoms and provide additional HEP exercises as appropriate.  ?  Manual traction   Baldemar Friday, PT, DPT 03/22/23 2:42 PM  [facility] [address] [place], [address] [place], [place] [address] Phone # [phone] Fax # [phone]

## 2023-03-22 ENCOUNTER — Ambulatory Visit: Payer: Worker's Compensation | Admitting: Physical Therapy

## 2023-03-22 ENCOUNTER — Encounter: Payer: Self-pay | Admitting: Physical Therapy

## 2023-03-22 DIAGNOSIS — M5459 Other low back pain: Secondary | ICD-10-CM

## 2023-03-22 DIAGNOSIS — R293 Abnormal posture: Secondary | ICD-10-CM

## 2023-03-22 DIAGNOSIS — M6281 Muscle weakness (generalized): Secondary | ICD-10-CM

## 2023-03-23 ENCOUNTER — Ambulatory Visit (INDEPENDENT_AMBULATORY_CARE_PROVIDER_SITE_OTHER): Payer: BC Managed Care – PPO | Admitting: Family Medicine

## 2023-03-23 ENCOUNTER — Telehealth: Payer: Self-pay

## 2023-03-23 ENCOUNTER — Encounter: Payer: Self-pay | Admitting: Family Medicine

## 2023-03-23 VITALS — BP 130/78 | HR 95 | Temp 98.3°F | Wt 220.6 lb

## 2023-03-23 DIAGNOSIS — M5416 Radiculopathy, lumbar region: Secondary | ICD-10-CM

## 2023-03-23 NOTE — Progress Notes (Unsigned)
Established Patient Office Visit  Subjective   Patient ID: Matthew Mendoza, male    DOB: 1975-07-10  Age: 48 y.o. MRN: [id number]  Chief Complaint  Patient presents with   Back Pain    HPI  {History (Optional):23778} Tong is seen for follow-up regarding low back pain.  Pain for started with injury at work.  Refer to prior notes for details.  His pain is essentially unchanged.  He continues to have left lower lumbar pain with occasional sharp radiation down left lower extremity.  He describes this as a "jolt ".  He also has frequent tingling involving predominantly left great toe.  He has noted that if he puts pressure on his foot this seems to abolish somewhat of the tingling sensation.  No urine or stool incontinence.  No fever.  No foot drop.  No definite low if lower extremity weakness.  Continues to take Flexeril intermittently and also was placed back on prednisone last week.  Plain films showed some nonspecific degenerative changes L4-5 and L5-S1.  He has scheduled MRI lumbar spine tomorrow. His work involves getting in and out of the vehicle frequently and some pulling and lifting and he feels incapable of doing this frequently.  He has difficulty with either standing or sitting for prolonged periods without getting up and down.  Past Medical History:  Diagnosis Date   Asthma    MRSA carrier    Past Surgical History:  Procedure Laterality Date   I & D EXTREMITY Left 05/01/2018   Procedure: IRRIGATION AND DEBRIDEMENT FINGER;  Surgeon: Bradly Bienenstock, MD;  Location: [facility] OR;  Service: Orthopedics;  Laterality: Left;    reports that he has been smoking cigarettes. He has never used smokeless tobacco. He reports that he does not drink alcohol and does not use drugs. family history includes Depression in his mother. No Known Allergies  Review of Systems  Constitutional:  Negative for chills, fever and weight loss.  Cardiovascular:  Negative for chest pain.  Musculoskeletal:   Positive for back pain.  Neurological:  Negative for focal weakness.      Objective:     BP 130/78 (BP Location: Left Arm, Patient Position: Sitting, Cuff Size: Normal)   Pulse 95   Temp 98.3 F (36.8 C) (Oral)   Wt 220 lb 9.6 oz (100.1 kg)   SpO2 96%   BMI 31.65 kg/m  BP Readings from Last 3 Encounters:  03/23/23 130/78  03/16/23 130/80  03/05/23 116/70   Wt Readings from Last 3 Encounters:  03/23/23 220 lb 9.6 oz (100.1 kg)  03/16/23 213 lb 12.8 oz (97 kg)  03/05/23 221 lb 3.2 oz (100.3 kg)      Physical Exam Vitals reviewed.  Constitutional:      General: He is not in acute distress.    Appearance: He is not ill-appearing.  Cardiovascular:     Rate and Rhythm: Normal rate and regular rhythm.  Neurological:     Mental Status: He is alert.     Comments: He has some pain with left dorsiflexion compared to the right but no definite weakness.  Reflexes are 2+ knee bilaterally and 1+ right ankle and trace left.  Normal sensory function with monofilament testing throughout left foot      No results found for any visits on 03/23/23.  {Labs (Optional):23779}  The ASCVD Risk score (Arnett DK, et al., 2019) failed to calculate for the following reasons:   Cannot find a previous HDL lab   Cannot  find a previous total cholesterol lab    Assessment & Plan:   Persistent left lumbar back pain following work injury.  He has left lumbar radiculitis symptoms with pain radiating down to the foot frequently along with tingling especially in the great toe and slightly diminished left ankle reflex compared to right.  MRI pending tomorrow.  -Continue with muscle relaxer as needed -Finish out prednisone -Avoid bending or heavy lifting -Work note extended through 04-09-2023 pending further evaluation. -Continue with physical therapy   Evelena Peat, MD

## 2023-03-23 NOTE — Telephone Encounter (Signed)
I spoke with the patient regarding FMLA paperwork

## 2023-03-23 NOTE — Telephone Encounter (Signed)
Copied from CRM [id number]. Topic: General - Call Back - No Documentation >> Mar 23, 2023  4:09 PM Leavy Cella D wrote: Reason for CRM: Patient received a miss call from Dr. Caryl Never nurse but no documentation is left in patients account . Pleas call back patient when available

## 2023-03-24 ENCOUNTER — Ambulatory Visit
Admission: RE | Admit: 2023-03-24 | Discharge: 2023-03-24 | Disposition: A | Discharge status: Home / Self Care | Payer: BC Managed Care – PPO | Source: Ambulatory Visit | Attending: Family Medicine | Admitting: Family Medicine

## 2023-03-24 DIAGNOSIS — M5416 Radiculopathy, lumbar region: Secondary | ICD-10-CM

## 2023-03-26 NOTE — Telephone Encounter (Signed)
Matthew Mendoza is needing any orders are any notes  from the patient appt on Mar 23 2023 he is calling from [employer] he would like a call back

## 2023-03-27 ENCOUNTER — Ambulatory Visit: Payer: BC Managed Care – PPO | Attending: Family Medicine

## 2023-03-27 DIAGNOSIS — R293 Abnormal posture: Secondary | ICD-10-CM | POA: Diagnosis present

## 2023-03-27 DIAGNOSIS — M6281 Muscle weakness (generalized): Secondary | ICD-10-CM | POA: Insufficient documentation

## 2023-03-27 DIAGNOSIS — M5459 Other low back pain: Secondary | ICD-10-CM | POA: Diagnosis present

## 2023-03-27 NOTE — Therapy (Signed)
 OUTPATIENT PHYSICAL THERAPY NEURO TREATMENT   Patient Name: NATHIN SARAN MRN: [id number] DOB:11/10/1975, 48 y.o., male Today's Date: 03/27/2023   PCP: Micheal Wolm ORN, MD REFERRING PROVIDER: Micheal Wolm ORN, MD  END OF SESSION:  PT End of Session - 03/27/23 1617     Visit Number 7    Number of Visits 12    Date for PT Re-Evaluation 04/13/23    Authorization Type BCBS    PT Start Time 1615    PT Stop Time 1700    PT Time Calculation (min) 45 min    Activity Tolerance Patient limited by pain    Behavior During Therapy Restless                Past Medical History:  Diagnosis Date   Asthma    MRSA carrier    Past Surgical History:  Procedure Laterality Date   I & D EXTREMITY Left 05/01/2018   Procedure: IRRIGATION AND DEBRIDEMENT FINGER;  Surgeon: Shari Easter, MD;  Location: [facility] OR;  Service: Orthopedics;  Laterality: Left;   There are no active problems to display for this patient.   ONSET DATE: 03/05/2023 (MD referral); pain began 02/27/2023  REFERRING DIAG: S39.012D (ICD-10-CM) - Strain of lumbar region, subsequent encounter   THERAPY DIAG:  Other low back pain  Muscle weakness (generalized)  Abnormal posture  Rationale for Evaluation and Treatment: Rehabilitation  SUBJECTIVE:                                                                                                                                                                                             SUBJECTIVE STATEMENT: LLE symptoms come and go, worse with prolonged sitting. Out from work until 04/09/23.  Pt reports since outset of injury at 02/27/23 feels about the same.    Pt accompanied by: self  PERTINENT HISTORY: L lumbar region back pain began 02/27/23, saw Dr. Micheal and was started on Naproxen /Flexeril  without much reported benefit; occasional tingling L thigh  PAIN:  Are you having pain? Yes: NPRS scale: 6/10 Pain location: L LE Pain description: hollow pain, tingling  radiates into L thigh and posterior leg into foot  Aggravating factors: standing, walking, increased weight on L foot Relieving factors: sitting; prednisone  has helped a little, heating pad/ warm shower  PRECAUTIONS: None  RED FLAGS: Pt does complain tingling into L foot    WEIGHT BEARING RESTRICTIONS: No  FALLS: Has patient fallen in last 6 months? No  LIVING ENVIRONMENT: Lives with: lives with their family Lives in: House/apartment Stairs: 2 steps onto deck Has following equipment at home: None  PLOF: Independent and  Vocation/Vocational requirements: pushing, lifting, getting in and out of car multiple times  PATIENT GOALS: To get back to normal mobility and get rid of pain  OBJECTIVE:   TODAY'S TREATMENT: 03/27/23 Activity Comments  Supported prone 1 pillow support. Reports feeling of tightness in left hamstring  palpation Tender to LIGHT palpation along left lumbar paraspinals and left piriformis.   Prone on elbows 2x10 1x10 with pillow under hips  LTR x 2 min   Posterior pelvic tilts 2x10 Cues for sequence for recruitment  Sitting slump stretch 1x10, cues in sequence  Treadmill training 2x5 min at 1.0 mph Cues for left hip extension to improve engagement and reduce antalgia x 5 min before rest period needed      TODAY'S TREATMENT: 03/22/23 Activity Comments  Nustep L2 x 6 min UEs/LEs  Had to stop and stand at 2.5 min d/t L LE pain   prone on elbows 10x3 Instructed to perform to tolerance; guarding   open book stretch 10x each  Initially pt just moving  arm, required cueing to encourage gentle spinal rotation to tolerance. Used hand behind head for best tolerance. Limited tolerance for L sidelying   Green medball STS to mat 2x10  Good tolerance   standing HS curl #5 10x each LE Leaning on TM rail; able to tolerate R better than L. Divided L side into 2x5   Sitting LAQ 5# 2x10 each  Unable to tolerate with # on L LE  Sitting KTOS and fig 4 stretch 30 each  Reports pain  in L HS when stretching R hip     PATIENT EDUCATION: Education details: answered pt's questions on degenerative disc disease; edu on fear avoidance activities  Person educated: Patient Education method: Explanation Education comprehension: verbalized understanding    HOME EXERCISE PROGRAM Last updated: 03/13/23 Access Code: [email or web address] URL: [email or web address] Date: 03/13/2023 Prepared by: [facility] - Outpatient  Rehab - [facility]  Exercises - Supine Lower Trunk Rotation  - 1 x daily - 7 x weekly - 3 sets - 5 reps - Supine Posterior Pelvic Tilt  - 1 x daily - 7 x weekly - 1-2 sets - 10 reps - Seated Pelvic Tilts  - 1 x daily - 7 x weekly - 1-2 sets - 10 reps - Hooklying Single Knee to Chest Stretch  - 1 x daily - 7 x weekly - 1 sets - 5 reps - 15-30 sec hold - Supine Sciatic Nerve Glide  - 1 x daily - 5 x weekly - 2 sets - 20 reps - Supine Figure 4 Piriformis Stretch  - 1 x daily - 5 x weekly - 2 sets - 30 reps - Prone Press Up On Elbows  - 1 x daily - 7 x weekly - 2 sets - 10 reps - Seated Slump Neural Tensioner  - 1 x daily - 7 x weekly - 3 sets - 10 reps      Note: Objective measures were completed at Evaluation unless otherwise noted.  DIAGNOSTIC FINDINGS: NA  COGNITION: Overall cognitive status: Within functional limits for tasks assessed   SENSATION: Light touch: Impaired  and feels lighter touch on LLE, especially distally  MUSCLE LENGTH:  SLR:  LLE 23 degrees, very painful in L lumbar area Attempted 90/90 hamstring measure, unable due to L lumbar pain  POSTURE: rounded shoulders, forward head, and very guarded/antalgic posture; decreased weightshift to LLE in sitting  Lumbar ROM: Flexion 20 degrees Extension 10 degrees  Repeated flexion:  pain more into  L shoulder (incr to 6/10 from 5/10) Repeated extension pain into thoracic spine and into L leg (5/10)  Palpation:  Pt very tender to palpation L lumbar musculature, paraspinals, L4-L5  facet area.  Tender to palpation at L buttocks and piriformis  LOWER EXTREMITY ROM Seated hamstring motion limited and guarded LLE  LOWER EXTREMITY MMT:    MMT Right Eval Left Eval  Hip flexion 4+ 3+ pain  Hip extension    Hip abduction    Hip adduction    Hip internal rotation    Hip external rotation    Knee flexion 4 3+ pain  Knee extension 3+ 3+  Ankle dorsiflexion 4 4  Ankle plantarflexion    Ankle inversion    Ankle eversion    (Blank rows = not tested)  TRANSFERS: Assistive device utilized:  BUE support, slowed, guarded, decreased LLE weightbearing and None  Sit to stand: Modified independence Stand to sit: Modified independence  GAIT: Gait pattern: step through pattern and antalgic Distance walked: 40 ft Assistive device utilized: None Level of assistance: Modified independence Comments: slowed, guarded, antalgic pattern   PATIENT SURVEYS:  Modified Oswestry Score = 30; 60% (41%-60% indicates severe disability)                                                                                                                                 GOALS: Goals reviewed with patient? Yes  SHORT TERM GOALS: Target date: 03/23/2023  Pt will be independent with HEP for improved pain, strength, flexibility. Baseline: Goal status: IN PROGRESS  2.  Pt will report decrease in pain with standing activities by 50%. Baseline: can go up to 10/10 Goal status: IN PROGRESS   LONG TERM GOALS: Target date: 04/13/2023  Pt will be independent with progression of HEP for improved pain, flexibility, strength, mobility. Baseline:  Goal status: IN PROGRESS  2.  Pt will improve lumbar ROM to [facility], for improved painfree ROM. Baseline: See above Goal status: IN PROGRESS  3.  Pt will verbalize posture/positioning/body mechanics for sleep, work, home activities to lessen pain. Baseline:  Goal status: IN PROGRESS  4.  Modified Oswestry score to improve to 40% or less, to indicate  decreased disability level due to back pain. Baseline: 60% Goal status: IN PROGRESS  ASSESSMENT:  CLINICAL IMPRESSION: Reports fluctuating presentation of radiating LLE pain with report of greater referred pain following prolonged sitting.  Continued with training emphasis extension bias with report of centralization. Instructed in core strength/recruitment to improve bracing for stabilization requiring verbal/tactile cues for sequence and execution.  Instructed in additional sitting nerve glide for sciatic via sitting slump with good performance noted.  Treadmill training to end session to improve hip extension with use of rails for offloading to improve hip mechanics and reduce antalgia.  Exhibits great difficulty with ambulation on treadmill at 1.0 mph and heavy LLE antalgia. At end of session observed leaving clinic without antalgia noted.  Continued  sessions to progress POC details to reduce pain and improve body mechanics for reduced instances of peripheralization.  OBJECTIVE IMPAIRMENTS: Abnormal gait, decreased mobility, difficulty walking, decreased ROM, decreased strength, impaired flexibility, postural dysfunction, and pain.   ACTIVITY LIMITATIONS: lifting, bending, sitting, standing, squatting, sleeping, transfers, bed mobility, locomotion level, and caring for others  PARTICIPATION LIMITATIONS: driving, shopping, community activity, and occupation  PERSONAL FACTORS: 1-2 comorbidities: see above  are also affecting patient's functional outcome.   REHAB POTENTIAL: Good  CLINICAL DECISION MAKING: Evolving/moderate complexity  EVALUATION COMPLEXITY: Moderate  PLAN:  PT FREQUENCY: 2x/week  PT DURATION: 6 weeks  PLANNED INTERVENTIONS: 97110-Therapeutic exercises, 97530- Therapeutic activity, [id number]- Neuromuscular re-education, 97535- Self Care, 02859- Manual therapy, [id number]- Gait training, 97014- Electrical stimulation (unattended), [id number]- Electrical stimulation (manual), [id number]-  Ultrasound, 02987- Traction (mechanical), Patient/Family education, Taping, Dry Needling, Joint mobilization, Cryotherapy, and Moist heat  PLAN FOR NEXT SESSION: review HEP; try STM for lumbar and piriformis.  Work to decrease peripheral symptoms/centralize symptoms and provide additional HEP exercises as appropriate.  ?Manual traction   5:03 PM, 03/27/23 M. Kelly Laurette Villescas, PT, DPT Physical Therapist- [place] Office Number: [phone]

## 2023-03-28 NOTE — Telephone Encounter (Signed)
 Noted no authorization has been signed by patient at this time.

## 2023-03-29 ENCOUNTER — Ambulatory Visit: Payer: BC Managed Care – PPO | Admitting: Physical Therapy

## 2023-03-30 ENCOUNTER — Telehealth: Payer: Self-pay

## 2023-03-30 NOTE — Telephone Encounter (Signed)
 I spoke with Stacy with the reading room and MRI has been placed as STAT

## 2023-03-30 NOTE — Telephone Encounter (Signed)
 Copied from CRM [id number]. Topic: Clinical - Lab/Test Results >> Mar 30, 2023 10:38 AM Corean SAUNDERS wrote: Reason for CRM: Patient is requesting Dr. Micheal or his nurse to please call him as he has not been contacted regarding his imaging results from 2/1 please call patients mobile number.

## 2023-04-03 ENCOUNTER — Telehealth: Payer: Self-pay | Admitting: Family Medicine

## 2023-04-03 ENCOUNTER — Ambulatory Visit: Payer: BC Managed Care – PPO

## 2023-04-03 DIAGNOSIS — R293 Abnormal posture: Secondary | ICD-10-CM

## 2023-04-03 DIAGNOSIS — M6281 Muscle weakness (generalized): Secondary | ICD-10-CM

## 2023-04-03 DIAGNOSIS — M5459 Other low back pain: Secondary | ICD-10-CM

## 2023-04-03 NOTE — Therapy (Signed)
OUTPATIENT PHYSICAL THERAPY NEURO TREATMENT   Patient Name: Matthew Mendoza MRN: [id number] DOB:02-Jun-1975, 48 y.o., male Today's Date: 04/03/2023   PCP: Kristian Covey, MD REFERRING PROVIDER: Kristian Covey, MD  END OF SESSION:  PT End of Session - 04/03/23 1410     Visit Number 8    Number of Visits 12    Date for PT Re-Evaluation 04/13/23    Authorization Type [employer]    PT Start Time 1410   late arrival   PT Stop Time 1445    PT Time Calculation (min) 35 min    Activity Tolerance Patient limited by pain    Behavior During Therapy Restless                Past Medical History:  Diagnosis Date   Asthma    MRSA carrier    Past Surgical History:  Procedure Laterality Date   I & D EXTREMITY Left 05/01/2018   Procedure: IRRIGATION AND DEBRIDEMENT FINGER;  Surgeon: Bradly Bienenstock, MD;  Location: [facility] OR;  Service: Orthopedics;  Laterality: Left;   There are no active problems to display for this patient.   ONSET DATE: 03/05/2023 (MD referral); pain began 02/27/2023  REFERRING DIAG: S39.012D (ICD-10-CM) - Strain of lumbar region, subsequent encounter   THERAPY DIAG:  Other low back pain  Muscle weakness (generalized)  Abnormal posture  Rationale for Evaluation and Treatment: Rehabilitation  SUBJECTIVE:                                                                                                                                                                                             SUBJECTIVE STATEMENT: Constant ache in left hip/buttocks with instances of centralization to back. Got results from MRI and referred to neurosurgeon, pt reports increased anxiety and apprehension related to MRI results and referral to neurosurgeon   Pt accompanied by: self  PERTINENT HISTORY: L lumbar region back pain began 02/27/23, saw Dr. Caryl Never and was started on Naproxen/Flexeril without much reported benefit; occasional tingling L thigh  PAIN:  Are you having pain?  Yes: NPRS scale: 6/10 Pain location: L LE Pain description: hollow pain, tingling radiates into L thigh and posterior leg into foot  Aggravating factors: standing, walking, increased weight on L foot Relieving factors: sitting; prednisone has helped a little, heating pad/ warm shower  PRECAUTIONS: None  RED FLAGS: Pt does complain tingling into L foot    WEIGHT BEARING RESTRICTIONS: No  FALLS: Has patient fallen in last 6 months? No  LIVING ENVIRONMENT: Lives with: lives with their family Lives in: House/apartment Stairs: 2 steps onto deck Has following  equipment at home: None  PLOF: Independent and Vocation/Vocational requirements: pushing, lifting, getting in and out of car multiple times  PATIENT GOALS: To get back to normal mobility and get rid of pain  OBJECTIVE:   TODAY'S TREATMENT: 04/03/23 Activity Comments  Discussion of lumbar spine kinematics and discussion/review of activities regarding peripheralization vs centralization.                         PATIENT EDUCATION: Education details: answered pt's questions on degenerative disc disease; edu on fear avoidance activities  Person educated: Patient Education method: Explanation Education comprehension: verbalized understanding    HOME EXERCISE PROGRAM Last updated: 03/13/23 Access Code: [email or web address] URL: [email or web address] Date: 03/13/2023 Prepared by: [facility] - Outpatient  Rehab - [facility]  Exercises - Supine Lower Trunk Rotation  - 1 x daily - 7 x weekly - 3 sets - 5 reps - Supine Posterior Pelvic Tilt  - 1 x daily - 7 x weekly - 1-2 sets - 10 reps - Seated Pelvic Tilts  - 1 x daily - 7 x weekly - 1-2 sets - 10 reps - Hooklying Single Knee to Chest Stretch  - 1 x daily - 7 x weekly - 1 sets - 5 reps - 15-30 sec hold - Supine Sciatic Nerve Glide  - 1 x daily - 5 x weekly - 2 sets - 20 reps - Supine Figure 4 Piriformis Stretch  - 1 x daily - 5 x weekly - 2 sets - 30 reps -  Prone Press Up On Elbows  - 1 x daily - 7 x weekly - 2 sets - 10 reps - Seated Slump Neural Tensioner  - 1 x daily - 7 x weekly - 3 sets - 10 reps      Note: Objective measures were completed at Evaluation unless otherwise noted.  DIAGNOSTIC FINDINGS: NA  COGNITION: Overall cognitive status: Within functional limits for tasks assessed   SENSATION: Light touch: Impaired  and feels lighter touch on LLE, especially distally  MUSCLE LENGTH:  SLR:  LLE 23 degrees, very painful in L lumbar area Attempted 90/90 hamstring measure, unable due to L lumbar pain  POSTURE: rounded shoulders, forward head, and very guarded/antalgic posture; decreased weightshift to LLE in sitting  Lumbar ROM: Flexion 20 degrees Extension 10 degrees  Repeated flexion:  pain more into L shoulder (incr to 6/10 from 5/10) Repeated extension pain into thoracic spine and into L leg (5/10)  Palpation:  Pt very tender to palpation L lumbar musculature, paraspinals, L4-L5 facet area.  Tender to palpation at L buttocks and piriformis  LOWER EXTREMITY ROM Seated hamstring motion limited and guarded LLE  LOWER EXTREMITY MMT:    MMT Right Eval Left Eval  Hip flexion 4+ 3+ pain  Hip extension    Hip abduction    Hip adduction    Hip internal rotation    Hip external rotation    Knee flexion 4 3+ pain  Knee extension 3+ 3+  Ankle dorsiflexion 4 4  Ankle plantarflexion    Ankle inversion    Ankle eversion    (Blank rows = not tested)  TRANSFERS: Assistive device utilized:  BUE support, slowed, guarded, decreased LLE weightbearing and None  Sit to stand: Modified independence Stand to sit: Modified independence  GAIT: Gait pattern: step through pattern and antalgic Distance walked: 40 ft Assistive device utilized: None Level of assistance: Modified independence Comments: slowed, guarded, antalgic pattern   PATIENT SURVEYS:  Modified Oswestry Score = 30; 60% (41%-60% indicates severe disability)                                                                                                                                  GOALS: Goals reviewed with patient? Yes  SHORT TERM GOALS: Target date: 03/23/2023  Pt will be independent with HEP for improved pain, strength, flexibility. Baseline: Goal status: IN PROGRESS  2.  Pt will report decrease in pain with standing activities by 50%. Baseline: can go up to 10/10 Goal status: IN PROGRESS   LONG TERM GOALS: Target date: 04/13/2023  Pt will be independent with progression of HEP for improved pain, flexibility, strength, mobility. Baseline:  Goal status: IN PROGRESS  2.  Pt will improve lumbar ROM to [facility], for improved painfree ROM. Baseline: See above Goal status: IN PROGRESS  3.  Pt will verbalize posture/positioning/body mechanics for sleep, work, home activities to lessen pain. Baseline:  Goal status: IN PROGRESS  4.  Modified Oswestry score to improve to 40% or less, to indicate decreased disability level due to back pain. Baseline: 60% Goal status: IN PROGRESS  ASSESSMENT:  CLINICAL IMPRESSION: Pt arrives to session reporting increased anxiety and worry regarding his MRI results and subsequent referral to neurosurgeon.  Discussed with pt regarding his symptoms and reinforced principles regarding his presentation and that he has responded quite well to extension-biased movements as these movements have been able to largely reduce his radiating LLE pain.  Pt verbalizes and demonstrates good understanding noting that flexion-biased movements tend to provoke his symptoms and increase radicular LLE pain.  Discussion of body's inflammatory process and rationale of medical and therapeutic alliance/interventions for optimal outcomes.  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  OBJECTIVE IMPAIRMENTS: Abnormal gait, decreased mobility, difficulty walking, decreased ROM, decreased strength, impaired flexibility, postural dysfunction, and pain.   ACTIVITY LIMITATIONS: lifting, bending, sitting, standing, squatting, sleeping, transfers, bed mobility, locomotion level, and caring for others  PARTICIPATION LIMITATIONS: driving, shopping, community activity, and occupation  PERSONAL FACTORS: 1-2 comorbidities: see above  are also affecting patient's functional outcome.   REHAB POTENTIAL: Good  CLINICAL DECISION MAKING: Evolving/moderate complexity  EVALUATION COMPLEXITY: Moderate  PLAN:  PT FREQUENCY: 2x/week  PT DURATION: 6 weeks  PLANNED INTERVENTIONS: 97110-Therapeutic exercises, 97530- Therapeutic activity, [id number]- Neuromuscular re-education, 97535- Self Care, 16109- Manual therapy, [id number]- Gait training, 97014- Electrical stimulation (unattended), [id number]- Electrical stimulation (manual), [id number]- Ultrasound, 60454- Traction (mechanical), Patient/Family education, Taping, Dry Needling, Joint mobilization, Cryotherapy, and Moist heat  PLAN FOR NEXT SESSION: review HEP;  Work to decrease peripheral symptoms/centralize symptoms and provide additional HEP exercises as appropriate. Functional lifting mechanics  ?Manual traction   2:10 PM, 04/03/23 M. Shary Decamp, PT, DPT Physical Therapist-  [place] Office Number: [phone]

## 2023-04-03 NOTE — Telephone Encounter (Signed)
Copied from CRM [id number]. Topic: Referral - Question >> Apr 03, 2023  8:39 AM Prudencio Pair wrote: Reason for CRM: Melinda Crutch, Nurse Case Manager, with [employer] called stating that he is needing the referral for neurosurgeon sent to him. He states he is working with the patient & he will be the one to make the appt. His CB #: [phone] for any further questions or concerns.

## 2023-04-04 NOTE — Therapy (Incomplete)
OUTPATIENT PHYSICAL THERAPY NEURO TREATMENT   Patient Name: Matthew Mendoza MRN: [id number] DOB:02-17-1976, 48 y.o., male Today's Date: 04/04/2023   PCP: Kristian Covey, MD REFERRING PROVIDER: Kristian Covey, MD  END OF SESSION:       Past Medical History:  Diagnosis Date   Asthma    MRSA carrier    Past Surgical History:  Procedure Laterality Date   I & D EXTREMITY Left 05/01/2018   Procedure: IRRIGATION AND DEBRIDEMENT FINGER;  Surgeon: Bradly Bienenstock, MD;  Location: [facility] OR;  Service: Orthopedics;  Laterality: Left;   There are no active problems to display for this patient.   ONSET DATE: 03/05/2023 (MD referral); pain began 02/27/2023  REFERRING DIAG: S39.012D (ICD-10-CM) - Strain of lumbar region, subsequent encounter   THERAPY DIAG:  No diagnosis found.  Rationale for Evaluation and Treatment: Rehabilitation  SUBJECTIVE:                                                                                                                                                                                             SUBJECTIVE STATEMENT: Constant ache in left hip/buttocks with instances of centralization to back. Got results from MRI and referred to neurosurgeon, pt reports increased anxiety and apprehension related to MRI results and referral to neurosurgeon   Pt accompanied by: self  PERTINENT HISTORY: L lumbar region back pain began 02/27/23, saw Dr. Caryl Never and was started on Naproxen/Flexeril without much reported benefit; occasional tingling L thigh  PAIN:  Are you having pain? Yes: NPRS scale: 6/10 Pain location: L LE Pain description: hollow pain, tingling radiates into L thigh and posterior leg into foot  Aggravating factors: standing, walking, increased weight on L foot Relieving factors: sitting; prednisone has helped a little, heating pad/ warm shower  PRECAUTIONS: None  RED FLAGS: Pt does complain tingling into L foot    WEIGHT BEARING  RESTRICTIONS: No  FALLS: Has patient fallen in last 6 months? No  LIVING ENVIRONMENT: Lives with: lives with their family Lives in: House/apartment Stairs: 2 steps onto deck Has following equipment at home: None  PLOF: Independent and Vocation/Vocational requirements: pushing, lifting, getting in and out of car multiple times  PATIENT GOALS: To get back to normal mobility and get rid of pain  OBJECTIVE:     TODAY'S TREATMENT: 04/05/23 Activity Comments                            HOME EXERCISE PROGRAM Last updated: 03/13/23 Access Code: [email or web address] URL: [email or web address] Date: 03/13/2023 Prepared by: [facility] - Outpatient  Rehab - [facility]  Exercises - Supine Lower Trunk Rotation  - 1 x daily - 7 x weekly - 3 sets - 5 reps - Supine Posterior Pelvic Tilt  - 1 x daily - 7 x weekly - 1-2 sets - 10 reps - Seated Pelvic Tilts  - 1 x daily - 7 x weekly - 1-2 sets - 10 reps - Hooklying Single Knee to Chest Stretch  - 1 x daily - 7 x weekly - 1 sets - 5 reps - 15-30 sec hold - Supine Sciatic Nerve Glide  - 1 x daily - 5 x weekly - 2 sets - 20 reps - Supine Figure 4 Piriformis Stretch  - 1 x daily - 5 x weekly - 2 sets - 30 reps - Prone Press Up On Elbows  - 1 x daily - 7 x weekly - 2 sets - 10 reps - Seated Slump Neural Tensioner  - 1 x daily - 7 x weekly - 3 sets - 10 reps      Note: Objective measures were completed at Evaluation unless otherwise noted.  DIAGNOSTIC FINDINGS: NA  COGNITION: Overall cognitive status: Within functional limits for tasks assessed   SENSATION: Light touch: Impaired  and feels lighter touch on LLE, especially distally  MUSCLE LENGTH:  SLR:  LLE 23 degrees, very painful in L lumbar area Attempted 90/90 hamstring measure, unable due to L lumbar pain  POSTURE: rounded shoulders, forward head, and very guarded/antalgic posture; decreased weightshift to LLE in sitting  Lumbar ROM: Flexion 20  degrees Extension 10 degrees  Repeated flexion:  pain more into L shoulder (incr to 6/10 from 5/10) Repeated extension pain into thoracic spine and into L leg (5/10)  Palpation:  Pt very tender to palpation L lumbar musculature, paraspinals, L4-L5 facet area.  Tender to palpation at L buttocks and piriformis  LOWER EXTREMITY ROM Seated hamstring motion limited and guarded LLE  LOWER EXTREMITY MMT:    MMT Right Eval Left Eval  Hip flexion 4+ 3+ pain  Hip extension    Hip abduction    Hip adduction    Hip internal rotation    Hip external rotation    Knee flexion 4 3+ pain  Knee extension 3+ 3+  Ankle dorsiflexion 4 4  Ankle plantarflexion    Ankle inversion    Ankle eversion    (Blank rows = not tested)  TRANSFERS: Assistive device utilized:  BUE support, slowed, guarded, decreased LLE weightbearing and None  Sit to stand: Modified independence Stand to sit: Modified independence  GAIT: Gait pattern: step through pattern and antalgic Distance walked: 40 ft Assistive device utilized: None Level of assistance: Modified independence Comments: slowed, guarded, antalgic pattern   PATIENT SURVEYS:  Modified Oswestry Score = 30; 60% (41%-60% indicates severe disability)  GOALS: Goals reviewed with patient? Yes  SHORT TERM GOALS: Target date: 03/23/2023  Pt will be independent with HEP for improved pain, strength, flexibility. Baseline: Goal status: IN PROGRESS  2.  Pt will report decrease in pain with standing activities by 50%. Baseline: can go up to 10/10 Goal status: IN PROGRESS   LONG TERM GOALS: Target date: 04/13/2023  Pt will be independent with progression of HEP for improved pain, flexibility, strength, mobility. Baseline:  Goal status: IN PROGRESS  2.  Pt will improve lumbar ROM to [facility], for improved painfree ROM. Baseline:  See above Goal status: IN PROGRESS  3.  Pt will verbalize posture/positioning/body mechanics for sleep, work, home activities to lessen pain. Baseline:  Goal status: IN PROGRESS  4.  Modified Oswestry score to improve to 40% or less, to indicate decreased disability level due to back pain. Baseline: 60% Goal status: IN PROGRESS  ASSESSMENT:  CLINICAL IMPRESSION: Pt arrives to session reporting increased anxiety and worry regarding his MRI results and subsequent referral to neurosurgeon.  Discussed with pt regarding his symptoms and reinforced principles regarding his presentation and that he has responded quite well to extension-biased movements as these movements have been able to largely reduce his radiating LLE pain.  Pt verbalizes and demonstrates good understanding noting that flexion-biased movements tend to provoke his symptoms and increase radicular LLE pain.  Discussion of body's inflammatory process and rationale of medical and therapeutic alliance/interventions for optimal outcomes.  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  OBJECTIVE IMPAIRMENTS: Abnormal gait, decreased mobility, difficulty walking, decreased ROM, decreased strength, impaired flexibility, postural dysfunction, and pain.   ACTIVITY LIMITATIONS: lifting, bending, sitting, standing, squatting, sleeping, transfers, bed mobility, locomotion level, and caring for others  PARTICIPATION LIMITATIONS: driving, shopping, community activity, and occupation  PERSONAL FACTORS: 1-2 comorbidities: see above  are also affecting patient's functional outcome.   REHAB POTENTIAL: Good  CLINICAL DECISION MAKING: Evolving/moderate complexity  EVALUATION COMPLEXITY: Moderate  PLAN:  PT  FREQUENCY: 2x/week  PT DURATION: 6 weeks  PLANNED INTERVENTIONS: 97110-Therapeutic exercises, 97530- Therapeutic activity, [id number]- Neuromuscular re-education, 97535- Self Care, 16109- Manual therapy, [id number]- Gait training, 97014- Electrical stimulation (unattended), [id number]- Electrical stimulation (manual), [id number]- Ultrasound, 60454- Traction (mechanical), Patient/Family education, Taping, Dry Needling, Joint mobilization, Cryotherapy, and Moist heat  PLAN FOR NEXT SESSION: review HEP;  Work to decrease peripheral symptoms/centralize symptoms and provide additional HEP exercises as appropriate. Functional lifting mechanics  ?Manual traction

## 2023-04-04 NOTE — Telephone Encounter (Signed)
Noted

## 2023-04-04 NOTE — Telephone Encounter (Signed)
Attempted to contact Matthew Mendoza but he wasn't able to answer the phone. I left a voicemail requesting the number he wants the referral faxed to.

## 2023-04-05 ENCOUNTER — Telehealth: Payer: Self-pay | Admitting: Family Medicine

## 2023-04-05 ENCOUNTER — Ambulatory Visit: Payer: BC Managed Care – PPO | Admitting: Physical Therapy

## 2023-04-05 NOTE — Telephone Encounter (Unsigned)
Copied from CRM [id number]. Topic: Referral - Question >> Apr 05, 2023  3:32 PM Sonny Dandy B wrote: Reason for CRM: Melinda Crutch case manager at [facility] services called to provide a fax number for a referral for neuro surgeon.   [phone]

## 2023-04-06 ENCOUNTER — Encounter: Payer: Self-pay | Admitting: Family Medicine

## 2023-04-06 ENCOUNTER — Ambulatory Visit: Payer: BC Managed Care – PPO | Admitting: Family Medicine

## 2023-04-06 VITALS — BP 140/84 | HR 101 | Temp 98.4°F | Wt 219.9 lb

## 2023-04-06 DIAGNOSIS — M5416 Radiculopathy, lumbar region: Secondary | ICD-10-CM | POA: Diagnosis not present

## 2023-04-06 NOTE — Patient Instructions (Signed)
Let me know if you have not heard back from neurosurgeon next week.

## 2023-04-06 NOTE — Progress Notes (Signed)
Established Patient Office Visit  Subjective   Patient ID: Matthew Mendoza, male    DOB: [birth year]  Age: 48 y.o. MRN: [id number]  Chief Complaint  Patient presents with   Medical Management of Chronic Issues    HPI   Mckennon is seen for follow-up regarding her low back pain with ongoing left lumbar radiculitis symptoms.  Pain is perhaps slightly better but still has daily relatively constant left lumbar radiculitis pain.  He continues to get physical therapy.  He had MRI since last visit.  This was reviewed with patient.  He has disc bulging at a couple levels but did have some evidence for disc desiccation at L4-5 with moderate bilateral lateral recess stenosis and moderate bilateral neural foraminal stenosis which could be affecting L5 nerve roots.  He is now off prednisone.  He has pending follow-up with neurosurgeon.  Referral has been placed but appointment has not yet been made.  We filled out paperwork for him regarding work.  He states his biggest challenges getting in and out of cars.  He has difficulty any squatting and cannot do any pushing or pulling or lifting at this time.  Unfortunately, his work does not apparently have any significant light duty options that would not involve getting in and out of a car regularly. Denies lower extremity numbness.  No urine or stool incontinence.  Narrative & Impression  CLINICAL DATA:  Lumbar radiculopathy. Low back pain radiating into the left foot.   EXAM: MRI LUMBAR SPINE WITHOUT CONTRAST   TECHNIQUE: Multiplanar, multisequence MR imaging of the lumbar spine was performed. No intravenous contrast was administered.   COMPARISON:  Lumbar spine radiographs 03/16/2023   FINDINGS: Segmentation:  Standard.   Alignment:  Mild lumbar levoscoliosis.  No listhesis.   Vertebrae: No fracture, suspicious marrow lesion, or significant marrow edema. Scattered small Schmorl's nodes.   Conus medullaris and cauda equina: Conus extends to the  L1 level. Conus and cauda equina appear normal.   Paraspinal and other soft tissues: Unremarkable.   Disc levels:   Diffuse congenital narrowing of the lumbar spinal canal due to short pedicles.   T12-L1 and L1-2: Negative.   L2-3: Minimal disc bulging and mild facet and ligamentum flavum hypertrophy result in borderline spinal and lateral recess stenosis without neural foraminal stenosis.   L3-4: Disc bulging and mild facet and ligamentum flavum hypertrophy result in borderline spinal stenosis, borderline to mild bilateral lateral recess stenosis, and borderline to mild bilateral neural foraminal stenosis.   L4-5: Disc desiccation and mild disc space narrowing. Disc bulging moderate facet and ligamentum flavum hypertrophy result in borderline to mild spinal stenosis, moderate bilateral lateral recess stenosis, and moderate bilateral neural foraminal stenosis. The L5 nerve roots could be affected bilaterally in the lateral recesses.   L5-S1: Left subarticular to left foraminal disc protrusion and mild facet hypertrophy result in mild left lateral recess stenosis and mild left neural foraminal stenosis without spinal stenosis.   IMPRESSION: 1. 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 2. 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 3. Mild left lateral recess and left neural foraminal stenosis at L5-S1.    Past Medical History:  Diagnosis Date   Asthma    MRSA carrier    Past Surgical History:  Procedure Laterality Date   I & D EXTREMITY Left 05/01/2018   Procedure: IRRIGATION AND DEBRIDEMENT FINGER;  Surgeon: Bradly Bienenstock, MD;  Location: [facility] OR;  Service: Orthopedics;  Laterality: Left;  reports that he has been smoking cigarettes. He has never used smokeless tobacco. He reports that he does not drink alcohol and does not use drugs. family history includes Depression in his mother. No Known  Allergies  Review of Systems  Constitutional:  Negative for chills, fever and weight loss.  Gastrointestinal:  Negative for abdominal pain.  Musculoskeletal:  Positive for back pain.      Objective:     BP (!) 140/84 (BP Location: Left Arm, Patient Position: Sitting, Cuff Size: Normal)   Pulse (!) 101   Temp 98.4 F (36.9 C) (Oral)   Wt 219 lb 14.4 oz (99.7 kg)   SpO2 97%   BMI 31.55 kg/m  BP Readings from Last 3 Encounters:  04/06/23 (!) 140/84  03/23/23 130/78  03/16/23 130/80   Wt Readings from Last 3 Encounters:  04/06/23 219 lb 14.4 oz (99.7 kg)  03/23/23 220 lb 9.6 oz (100.1 kg)  03/16/23 213 lb 12.8 oz (97 kg)      Physical Exam Vitals reviewed.  Constitutional:      General: He is not in acute distress.    Appearance: He is not ill-appearing.  Cardiovascular:     Rate and Rhythm: Normal rate and regular rhythm.  Neurological:     Mental Status: He is alert.     Comments: 2+ patellar reflexes bilaterally.  1+ ankle reflex on the right and trace on the left.  This has been slightly diminished since his first visit.  He has some pain with dorsiflexion but no significant weakness      No results found for any visits on 04/06/23.    The ASCVD Risk score (Arnett DK, et al., 2019) failed to calculate for the following reasons:   Cannot find a previous HDL lab   Cannot find a previous total cholesterol lab    Assessment & Plan:   Persistent left lumbar back pain with radiculitis symptoms following injury at work.  MRI scan as above.  Pending follow-up with neurosurgeon.  Continue to avoid activities that could exacerbate such as squatting, lifting, etc. -Continue physical therapy -We discussed prospect of light duty.  Unfortunately his job does not apparently have any light duty.  One of his great struggles is getting in and out of a car regularly. -We spent 30 minutes between face-to-face and non-face-to-face time reviewing recent MRI, current work  restrictions/limitations, and neck steps in management.  He has neurosurgical appointment pending  Evelena Peat, MD

## 2023-04-09 NOTE — Therapy (Signed)
OUTPATIENT PHYSICAL THERAPY NEURO TREATMENT   Patient Name: Matthew Mendoza MRN: [id number] DOB:31-Aug-1975, 48 y.o., male Today's Date: 04/10/2023   PCP: Kristian Covey, MD REFERRING PROVIDER: Kristian Covey, MD  END OF SESSION:  PT End of Session - 04/10/23 1425     Visit Number 9    Number of Visits 12    Date for PT Re-Evaluation 04/13/23    Authorization Type BC[employer]    PT Start Time 1418   pt late   PT Stop Time 1445    PT Time Calculation (min) 27 min    Activity Tolerance Patient limited by pain    Behavior During Therapy Restless                 Past Medical History:  Diagnosis Date   Asthma    MRSA carrier    Past Surgical History:  Procedure Laterality Date   I & D EXTREMITY Left 05/01/2018   Procedure: IRRIGATION AND DEBRIDEMENT FINGER;  Surgeon: Bradly Bienenstock, MD;  Location: [facility] OR;  Service: Orthopedics;  Laterality: Left;   There are no active problems to display for this patient.   ONSET DATE: 03/05/2023 (MD referral); pain began 02/27/2023  REFERRING DIAG: S39.012D (ICD-10-CM) - Strain of lumbar region, subsequent encounter   THERAPY DIAG:  Other low back pain  Muscle weakness (generalized)  Abnormal posture  Rationale for Evaluation and Treatment: Rehabilitation  SUBJECTIVE:                                                                                                                                                                                             SUBJECTIVE STATEMENT: Apologizes for being late- got times mixed up. MD recommended appointment with neurosurgeon.    Pt accompanied by: self  PERTINENT HISTORY: L lumbar region back pain began 02/27/23, saw Dr. Caryl Never and was started on Naproxen/Flexeril without much reported benefit; occasional tingling L thigh  PAIN:  Are you having pain? Yes: NPRS scale: 5/10 Pain location: L LE Pain description: "soft ache"  Aggravating factors: standing, walking, increased weight on  L foot Relieving factors: sitting; prednisone has helped a little, heating pad/ warm shower  PRECAUTIONS: None  RED FLAGS: Pt does complain tingling into L foot    WEIGHT BEARING RESTRICTIONS: No  FALLS: Has patient fallen in last 6 months? No  LIVING ENVIRONMENT: Lives with: lives with their family Lives in: House/apartment Stairs: 2 steps onto deck Has following equipment at home: None  PLOF: Independent and Vocation/Vocational requirements: pushing, lifting, getting in and out of car multiple times  PATIENT GOALS: To get back to normal  mobility and get rid of pain  OBJECTIVE:     TODAY'S TREATMENT: 04/10/23 Activity Comments  TM walking with cues for weight shifting onto L LE and posture x 6 min 1.2-1.5 mph; increased R-lean evident in stance. Stopped at 4 min d/t "L hip cramping" before continuing   Hooklying fig 4, KTOS 30" To tolerance; pt reports tightness in L LE  bridge ball 10x  Advised pt to move within pain-free range  sitting LAQ #5 15x Very slow and difficult on L LE      PATIENT EDUCATION: Education details: discussion on reassessment next session and probable DC d/t symptoms unchanged - patient agreeable with this plan; advised patient to speak to his neurosurgeon about his prognosis; edu on fear avoidance behaviors d/t pt reporting his MD advising him to avoid repetitive movements  Person educated: Patient Education method: Explanation Education comprehension: verbalized understanding     HOME EXERCISE PROGRAM Last updated: 03/13/23 Access Code: [email or web address] URL: [email or web address] Date: 03/13/2023 Prepared by: [facility] - Outpatient  Rehab - [facility]  Exercises - Supine Lower Trunk Rotation  - 1 x daily - 7 x weekly - 3 sets - 5 reps - Supine Posterior Pelvic Tilt  - 1 x daily - 7 x weekly - 1-2 sets - 10 reps - Seated Pelvic Tilts  - 1 x daily - 7 x weekly - 1-2 sets - 10 reps - Hooklying Single Knee to Chest Stretch  - 1 x  daily - 7 x weekly - 1 sets - 5 reps - 15-30 sec hold - Supine Sciatic Nerve Glide  - 1 x daily - 5 x weekly - 2 sets - 20 reps - Supine Figure 4 Piriformis Stretch  - 1 x daily - 5 x weekly - 2 sets - 30 reps - Prone Press Up On Elbows  - 1 x daily - 7 x weekly - 2 sets - 10 reps - Seated Slump Neural Tensioner  - 1 x daily - 7 x weekly - 3 sets - 10 reps      Note: Objective measures were completed at Evaluation unless otherwise noted.  DIAGNOSTIC FINDINGS: NA  COGNITION: Overall cognitive status: Within functional limits for tasks assessed   SENSATION: Light touch: Impaired  and feels lighter touch on LLE, especially distally  MUSCLE LENGTH:  SLR:  LLE 23 degrees, very painful in L lumbar area Attempted 90/90 hamstring measure, unable due to L lumbar pain  POSTURE: rounded shoulders, forward head, and very guarded/antalgic posture; decreased weightshift to LLE in sitting  Lumbar ROM: Flexion 20 degrees Extension 10 degrees  Repeated flexion:  pain more into L shoulder (incr to 6/10 from 5/10) Repeated extension pain into thoracic spine and into L leg (5/10)  Palpation:  Pt very tender to palpation L lumbar musculature, paraspinals, L4-L5 facet area.  Tender to palpation at L buttocks and piriformis  LOWER EXTREMITY ROM Seated hamstring motion limited and guarded LLE  LOWER EXTREMITY MMT:    MMT Right Eval Left Eval  Hip flexion 4+ 3+ pain  Hip extension    Hip abduction    Hip adduction    Hip internal rotation    Hip external rotation    Knee flexion 4 3+ pain  Knee extension 3+ 3+  Ankle dorsiflexion 4 4  Ankle plantarflexion    Ankle inversion    Ankle eversion    (Blank rows = not tested)  TRANSFERS: Assistive device utilized:  BUE support, slowed, guarded, decreased LLE weightbearing  and None  Sit to stand: Modified independence Stand to sit: Modified independence  GAIT: Gait pattern: step through pattern and antalgic Distance walked: 40  ft Assistive device utilized: None Level of assistance: Modified independence Comments: slowed, guarded, antalgic pattern   PATIENT SURVEYS:  Modified Oswestry Score = 30; 60% (41%-60% indicates severe disability)                                                                                                                                 GOALS: Goals reviewed with patient? Yes  SHORT TERM GOALS: Target date: 03/23/2023  Pt will be independent with HEP for improved pain, strength, flexibility. Baseline: Goal status: IN PROGRESS  2.  Pt will report decrease in pain with standing activities by 50%. Baseline: can go up to 10/10 Goal status: IN PROGRESS   LONG TERM GOALS: Target date: 04/13/2023  Pt will be independent with progression of HEP for improved pain, flexibility, strength, mobility. Baseline:  Goal status: IN PROGRESS  2.  Pt will improve lumbar ROM to [facility], for improved painfree ROM. Baseline: See above Goal status: IN PROGRESS  3.  Pt will verbalize posture/positioning/body mechanics for sleep, work, home activities to lessen pain. Baseline:  Goal status: IN PROGRESS  4.  Modified Oswestry score to improve to 40% or less, to indicate decreased disability level due to back pain. Baseline: 60% Goal status: IN PROGRESS  ASSESSMENT:  CLINICAL IMPRESSION: Patient arrived to session without new complaints. Reports continued L LE pain. Gait training on treadmill revealed R-lean in R stance. Patient with c/o L LE tightness after 6 minutes of ambulation, thus addressed with stretching to tolerance. Glute strengthening activities were performing within limited range d/t c/o pain. Patient continues to demonstrate very limited tolerance for even sitting open chain activities and efforts to educate on fear-avoidance behaviors has not been successful. Plan for DC next session d/t limited benefit from PT services.    OBJECTIVE IMPAIRMENTS: Abnormal gait, decreased mobility,  difficulty walking, decreased ROM, decreased strength, impaired flexibility, postural dysfunction, and pain.   ACTIVITY LIMITATIONS: lifting, bending, sitting, standing, squatting, sleeping, transfers, bed mobility, locomotion level, and caring for others  PARTICIPATION LIMITATIONS: driving, shopping, community activity, and occupation  PERSONAL FACTORS: 1-2 comorbidities: see above  are also affecting patient's functional outcome.   REHAB POTENTIAL: Good  CLINICAL DECISION MAKING: Evolving/moderate complexity  EVALUATION COMPLEXITY: Moderate  PLAN:  PT FREQUENCY: 2x/week  PT DURATION: 6 weeks  PLANNED INTERVENTIONS: 97110-Therapeutic exercises, 97530- Therapeutic activity, [id number]- Neuromuscular re-education, 97535- Self Care, 72536- Manual therapy, [id number]- Gait training, 97014- Electrical stimulation (unattended), [id number]- Electrical stimulation (manual), [id number]- Ultrasound, 64403- Traction (mechanical), Patient/Family education, Taping, Dry Needling, Joint mobilization, Cryotherapy, and Moist heat  PLAN FOR NEXT SESSION: DC   Baldemar Friday, PT, DPT 04/10/23 2:47 PM  [facility] at [facility] [address] [place], [place] [address] Phone # [phone] Fax # [phone])  [phone]    

## 2023-04-10 ENCOUNTER — Ambulatory Visit: Payer: BC Managed Care – PPO | Admitting: Physical Therapy

## 2023-04-10 ENCOUNTER — Encounter: Payer: Self-pay | Admitting: Physical Therapy

## 2023-04-10 DIAGNOSIS — R293 Abnormal posture: Secondary | ICD-10-CM

## 2023-04-10 DIAGNOSIS — M6281 Muscle weakness (generalized): Secondary | ICD-10-CM

## 2023-04-10 DIAGNOSIS — M5459 Other low back pain: Secondary | ICD-10-CM | POA: Diagnosis not present

## 2023-04-11 NOTE — Therapy (Signed)
 OUTPATIENT PHYSICAL THERAPY NEURO TREATMENT   Patient Name: Matthew Mendoza MRN: [id number] DOB:1975-07-17, 48 y.o., male Today's Date: 04/11/2023   PCP: Kristian Covey, MD REFERRING PROVIDER: Kristian Covey, MD  END OF SESSION:        Past Medical History:  Diagnosis Date   Asthma    MRSA carrier    Past Surgical History:  Procedure Laterality Date   I & D EXTREMITY Left 05/01/2018   Procedure: IRRIGATION AND DEBRIDEMENT FINGER;  Surgeon: Bradly Bienenstock, MD;  Location: [facility] OR;  Service: Orthopedics;  Laterality: Left;   There are no active problems to display for this patient.   ONSET DATE: 03/05/2023 (MD referral); pain began 02/27/2023  REFERRING DIAG: S39.012D (ICD-10-CM) - Strain of lumbar region, subsequent encounter   THERAPY DIAG:  No diagnosis found.  Rationale for Evaluation and Treatment: Rehabilitation  SUBJECTIVE:                                                                                                                                                                                             SUBJECTIVE STATEMENT: Apologizes for being late- got times mixed up. MD recommended appointment with neurosurgeon.    Pt accompanied by: self  PERTINENT HISTORY: L lumbar region back pain began 02/27/23, saw Dr. Caryl Never and was started on Naproxen/Flexeril without much reported benefit; occasional tingling L thigh  PAIN:  Are you having pain? Yes: NPRS scale: 5/10 Pain location: L LE Pain description: "soft ache"  Aggravating factors: standing, walking, increased weight on L foot Relieving factors: sitting; prednisone has helped a little, heating pad/ warm shower  PRECAUTIONS: None  RED FLAGS: Pt does complain tingling into L foot    WEIGHT BEARING RESTRICTIONS: No  FALLS: Has patient fallen in last 6 months? No  LIVING ENVIRONMENT: Lives with: lives with their family Lives in: House/apartment Stairs: 2 steps onto deck Has following  equipment at home: None  PLOF: Independent and Vocation/Vocational requirements: pushing, lifting, getting in and out of car multiple times  PATIENT GOALS: To get back to normal mobility and get rid of pain  OBJECTIVE:    TODAY'S TREATMENT: 04/12/23 Activity Comments                          HOME EXERCISE PROGRAM Last updated: 03/13/23 Access Code: [email or web address] URL: [email or web address] Date: 03/13/2023 Prepared by: [facility] - Outpatient  Rehab - [facility]  Exercises - Supine Lower Trunk Rotation  - 1 x daily - 7 x weekly - 3 sets - 5 reps - Supine Posterior Pelvic Tilt  - 1  x daily - 7 x weekly - 1-2 sets - 10 reps - Seated Pelvic Tilts  - 1 x daily - 7 x weekly - 1-2 sets - 10 reps - Hooklying Single Knee to Chest Stretch  - 1 x daily - 7 x weekly - 1 sets - 5 reps - 15-30 sec hold - Supine Sciatic Nerve Glide  - 1 x daily - 5 x weekly - 2 sets - 20 reps - Supine Figure 4 Piriformis Stretch  - 1 x daily - 5 x weekly - 2 sets - 30 reps - Prone Press Up On Elbows  - 1 x daily - 7 x weekly - 2 sets - 10 reps - Seated Slump Neural Tensioner  - 1 x daily - 7 x weekly - 3 sets - 10 reps      Note: Objective measures were completed at Evaluation unless otherwise noted.  DIAGNOSTIC FINDINGS: NA  COGNITION: Overall cognitive status: Within functional limits for tasks assessed   SENSATION: Light touch: Impaired  and feels lighter touch on LLE, especially distally  MUSCLE LENGTH:  SLR:  LLE 23 degrees, very painful in L lumbar area Attempted 90/90 hamstring measure, unable due to L lumbar pain  POSTURE: rounded shoulders, forward head, and very guarded/antalgic posture; decreased weightshift to LLE in sitting  Lumbar ROM: Flexion 20 degrees Extension 10 degrees  Repeated flexion:  pain more into L shoulder (incr to 6/10 from 5/10) Repeated extension pain into thoracic spine and into L leg (5/10)  Palpation:  Pt very tender to palpation L  lumbar musculature, paraspinals, L4-L5 facet area.  Tender to palpation at L buttocks and piriformis  LOWER EXTREMITY ROM Seated hamstring motion limited and guarded LLE  LOWER EXTREMITY MMT:    MMT Right Eval Left Eval  Hip flexion 4+ 3+ pain  Hip extension    Hip abduction    Hip adduction    Hip internal rotation    Hip external rotation    Knee flexion 4 3+ pain  Knee extension 3+ 3+  Ankle dorsiflexion 4 4  Ankle plantarflexion    Ankle inversion    Ankle eversion    (Blank rows = not tested)  TRANSFERS: Assistive device utilized:  BUE support, slowed, guarded, decreased LLE weightbearing and None  Sit to stand: Modified independence Stand to sit: Modified independence  GAIT: Gait pattern: step through pattern and antalgic Distance walked: 40 ft Assistive device utilized: None Level of assistance: Modified independence Comments: slowed, guarded, antalgic pattern   PATIENT SURVEYS:  Modified Oswestry Score = 30; 60% (41%-60% indicates severe disability)                                                                                                                                 GOALS: Goals reviewed with patient? Yes  SHORT TERM GOALS: Target date: 03/23/2023  Pt will be independent with HEP for improved pain, strength, flexibility. Baseline: Goal  status: IN PROGRESS  2.  Pt will report decrease in pain with standing activities by 50%. Baseline: can go up to 10/10 Goal status: IN PROGRESS   LONG TERM GOALS: Target date: 04/13/2023  Pt will be independent with progression of HEP for improved pain, flexibility, strength, mobility. Baseline:  Goal status: IN PROGRESS  2.  Pt will improve lumbar ROM to [facility], for improved painfree ROM. Baseline: See above Goal status: IN PROGRESS  3.  Pt will verbalize posture/positioning/body mechanics for sleep, work, home activities to lessen pain. Baseline:  Goal status: IN PROGRESS  4.  Modified Oswestry score  to improve to 40% or less, to indicate decreased disability level due to back pain. Baseline: 60% Goal status: IN PROGRESS  ASSESSMENT:  CLINICAL IMPRESSION: Patient arrived to session without new complaints. Reports continued L LE pain. Gait training on treadmill revealed R-lean in R stance. Patient with c/o L LE tightness after 6 minutes of ambulation, thus addressed with stretching to tolerance. Glute strengthening activities were performing within limited range d/t c/o pain. Patient continues to demonstrate very limited tolerance for even sitting open chain activities and efforts to educate on fear-avoidance behaviors has not been successful. Plan for DC next session d/t limited benefit from PT services.    OBJECTIVE IMPAIRMENTS: Abnormal gait, decreased mobility, difficulty walking, decreased ROM, decreased strength, impaired flexibility, postural dysfunction, and pain.   ACTIVITY LIMITATIONS: lifting, bending, sitting, standing, squatting, sleeping, transfers, bed mobility, locomotion level, and caring for others  PARTICIPATION LIMITATIONS: driving, shopping, community activity, and occupation  PERSONAL FACTORS: 1-2 comorbidities: see above  are also affecting patient's functional outcome.   REHAB POTENTIAL: Good  CLINICAL DECISION MAKING: Evolving/moderate complexity  EVALUATION COMPLEXITY: Moderate  PLAN:  PT FREQUENCY: 2x/week  PT DURATION: 6 weeks  PLANNED INTERVENTIONS: 97110-Therapeutic exercises, 97530- Therapeutic activity, [id number]- Neuromuscular re-education, 97535- Self Care, 16109- Manual therapy, [id number]- Gait training, 97014- Electrical stimulation (unattended), [id number]- Electrical stimulation (manual), [id number]- Ultrasound, 60454- Traction (mechanical), Patient/Family education, Taping, Dry Needling, Joint mobilization, Cryotherapy, and Moist heat  PLAN FOR NEXT SESSION: DC   Baldemar Friday, PT, DPT 04/11/23 7:58 AM  [facility] at  [facility] [address], [address] [place], [place] [address] Phone # [phone] Fax # [phone]

## 2023-04-12 ENCOUNTER — Ambulatory Visit: Payer: BC Managed Care – PPO | Admitting: Physical Therapy

## 2023-04-12 ENCOUNTER — Encounter: Payer: Self-pay | Admitting: Physical Therapy

## 2023-04-12 DIAGNOSIS — M5459 Other low back pain: Secondary | ICD-10-CM

## 2023-04-12 DIAGNOSIS — R293 Abnormal posture: Secondary | ICD-10-CM

## 2023-04-12 DIAGNOSIS — M6281 Muscle weakness (generalized): Secondary | ICD-10-CM

## 2023-04-12 NOTE — Patient Instructions (Signed)

## 2023-04-19 ENCOUNTER — Telehealth: Payer: Self-pay | Admitting: *Deleted

## 2023-04-19 NOTE — Telephone Encounter (Signed)
 Copied from CRM [id number]. Topic: General - Other >> Apr 19, 2023 12:32 PM Truddie Crumble wrote: Reason for CRM: patient called stating he would like to see if the doctor can extend his worknote because his neurology appointment is not until 3/13 and he was going to wait until after he see the neurology  CB [phone]

## 2023-04-20 NOTE — Telephone Encounter (Signed)
 Work note updated and sent via [employer]

## 2023-05-04 ENCOUNTER — Telehealth: Payer: Self-pay | Admitting: *Deleted

## 2023-05-04 NOTE — Telephone Encounter (Signed)
 Copied from CRM [id number]. Topic: General - Other >> May 04, 2023  9:47 AM Elizebeth Brooking wrote: Reason for CRM: Patient called in regarding leave paperwork, stated the [facility]ford contact him to see if Dr.Butchette has everything they need to get paperwork. Stated they faxed it over to be completed. Would like to know if patient needs to schedule appointment to get paperwork completed [phone]

## 2023-05-07 NOTE — Telephone Encounter (Signed)
 Noted.  Kristian Covey MD [facility] at [facility]

## 2023-05-07 NOTE — Telephone Encounter (Signed)
 Patient informed of the message below. Patient reported he has been seen by Neurosurgeon and he as completed his most recent forms. Patient reported he does not need any paperwork completed at this time but will follow up if anything changes.

## 2023-07-18 ENCOUNTER — Ambulatory Visit: Payer: Self-pay | Admitting: Family Medicine

## 2023-07-18 NOTE — Telephone Encounter (Signed)
 Chief Complaint: antibiotic Symptoms: bite sites red & puffy Frequency: constant Pertinent Negatives: Patient denies fever, rash, headache, aches Disposition: [] ED /[] Urgent Care (no appt availability in office) / [x] Appointment(In office/virtual)/ []  [facility]/ [] Home Care/ [x] Refused Recommended Disposition /[] [facility]/ []  Follow-up with PCP Additional Notes:  Pulls ticks off on a weekly basis. In the past two weeks he removed 4-5 ticks. One tick he removed then noticed 2 days later he developed runny nose, was worried then other family members developed same symptoms.  He had a left over doxycycline  that he started taking on Saturday (took 6 doses) now out.  3-4 tick bits are red and puffy. No rashes noted. Kel reports he spoke with Dr. Darren Em yesterday during visit with daughter and he said if he needs antibiotics to request via MyChart or call. Declines office visit, [facility].   Copied from CRM #[id number]. Topic: Clinical - Red Word Triage >> Jul 18, 2023  9:43 AM Dewanda Foots wrote: Red Word that prompted transfer to Nurse Triage: Pt states he has 3-4 tick bites that he noticed last week.   One of the sites is red and swollen on the upper left thigh.  Pt wants doxycycline  (VIBRAMYCIN ) 100 MG capsule called in for him.   This is his preferred pharmacy:[id number] [place], [place] - [address] [place] [address] Phone: [phone] Fax: [phone] Hours: Not open 24 hours Reason for Disposition  [1] Red or very tender (to touch) area AND [2] started over 24 hours after the bite  Protocols used: Tick Bite-A-AH

## 2023-12-06 ENCOUNTER — Ambulatory Visit: Admitting: Physical Therapy

## 2024-01-18 ENCOUNTER — Telehealth: Admitting: Family Medicine

## 2024-01-18 DIAGNOSIS — R21 Rash and other nonspecific skin eruption: Secondary | ICD-10-CM | POA: Diagnosis not present

## 2024-01-18 MED ORDER — PREDNISONE 10 MG (21) PO TBPK
ORAL_TABLET | ORAL | 0 refills | Status: AC
Start: 2024-01-18 — End: ?

## 2024-01-18 NOTE — Progress Notes (Signed)
 Virtual Visit Consent   Matthew Mendoza, you are scheduled for a virtual visit with a [facility] provider today. Just as with appointments in the office, your consent must be obtained to participate. Your consent will be active for this visit and any virtual visit you may have with one of our providers in the next 365 days. If you have a MyChart account, a copy of this consent can be sent to you electronically.  As this is a virtual visit, video technology does not allow for your provider to perform a traditional examination. This may limit your provider's ability to fully assess your condition. If your provider identifies any concerns that need to be evaluated in person or the need to arrange testing (such as labs, EKG, etc.), we will make arrangements to do so. Although advances in technology are sophisticated, we cannot ensure that it will always work on either your end or our end. If the connection with a video visit is poor, the visit may have to be switched to a telephone visit. With either a video or telephone visit, we are not always able to ensure that we have a secure connection.  By engaging in this virtual visit, you consent to the provision of healthcare and authorize for your insurance to be billed (if applicable) for the services provided during this visit. Depending on your insurance coverage, you may receive a charge related to this service.  I need to obtain your verbal consent now. Are you willing to proceed with your visit today? Matthew Mendoza has provided verbal consent on 01/18/2024 for a virtual visit (video or telephone). Matthew Mendoza, [place]  Date: 01/18/2024 4:39 PM   Virtual Visit via Video Note   I, Matthew Mendoza, connected with  Matthew Mendoza  ([id number], 1975-04-12) on 01/18/24 at  4:30 PM EST by a video-enabled telemedicine application and verified that I am speaking with the correct person using two identifiers.  Location: Patient: Virtual Visit Location Patient:  Home Provider: Virtual Visit Location Provider: Home Office   I discussed the limitations of evaluation and management by telemedicine and the availability of in person appointments. The patient expressed understanding and agreed to proceed.    History of Present Illness: Matthew Mendoza is a 48 y.o. who identifies as a male who was assigned male at birth, and is being seen today for c/o recently started to break out with a rash on going for a few days.  Pt states he has red spots on arms, chest area and abdomen.  Pt states he tried oatmeal bath, poison oak medicine. Pt states he has not been outside.  Pt states he hasn't changed detergent, soaps or anything like that.  Pt states the rash is itchy and feels like its spreading. Pt denies eating anything out of the normal. Pt is against taking medications but states at this point he would like to take something for the symptoms.   HPI: HPI  Problems: There are no active problems to display for this patient.   Allergies: No Known Allergies Medications:  Current Outpatient Medications:    predniSONE  (STERAPRED UNI-PAK 21 TAB) 10 MG (21) TBPK tablet, Take following package directions., Disp: 21 tablet, Rfl: 0   cyclobenzaprine  (FLEXERIL ) 10 MG tablet, Take 1 tablet (10 mg total) by mouth 3 (three) times daily as needed for muscle spasms., Disp: 30 tablet, Rfl: 0   doxycycline  (VIBRAMYCIN ) 100 MG capsule, Take 1 capsule (100 mg total) by mouth 2 (two) times daily.,  Disp: 20 capsule, Rfl: 0   naproxen  (NAPROSYN ) 500 MG tablet, Take 1 tablet (500 mg total) by mouth 2 (two) times daily with a meal., Disp: 30 tablet, Rfl: 0  Observations/Objective: Patient is well-developed, well-nourished in no acute distress.  Resting comfortably at home.  Head is normocephalic, atraumatic.  No labored breathing.  Speech is clear and coherent with logical content.  Patient is alert and oriented at baseline.    Assessment and Plan: 1. Rash and nonspecific skin  eruption (Primary) - predniSONE  (STERAPRED UNI-PAK 21 TAB) 10 MG (21) TBPK tablet; Take following package directions.  Dispense: 21 tablet; Refill: 0  -Pt advised to follow up in person with urgent care or PCP if symptoms persist or worsen.  Follow Up Instructions: I discussed the assessment and treatment plan with the patient. The patient was provided an opportunity to ask questions and all were answered. The patient agreed with the plan and demonstrated an understanding of the instructions.  A copy of instructions were sent to the patient via MyChart unless otherwise noted below.    The patient was advised to call back or seek an in-person evaluation if the symptoms worsen or if the condition fails to improve as anticipated.    Matthew Mater, PA-C

## 2024-01-18 NOTE — Patient Instructions (Signed)
 Matthew Mendoza, thank you for joining Roosvelt Mater, PA-C for today's virtual visit.  While this provider is not your primary care provider (PCP), if your PCP is located in our provider database this encounter information will be shared with them immediately following your visit.   A [facility] MyChart account gives you access to today's visit and all your visits, tests, and labs performed at [facility]  click here if you don't have a [facility] MyChart account or go to [email or web address]  Consent: (Patient) Matthew Mendoza provided verbal consent for this virtual visit at the beginning of the encounter.  Current Medications:  Current Outpatient Medications:    predniSONE  (STERAPRED UNI-PAK 21 TAB) 10 MG (21) TBPK tablet, Take following package directions., Disp: 21 tablet, Rfl: 0   cyclobenzaprine  (FLEXERIL ) 10 MG tablet, Take 1 tablet (10 mg total) by mouth 3 (three) times daily as needed for muscle spasms., Disp: 30 tablet, Rfl: 0   doxycycline  (VIBRAMYCIN ) 100 MG capsule, Take 1 capsule (100 mg total) by mouth 2 (two) times daily., Disp: 20 capsule, Rfl: 0   naproxen  (NAPROSYN ) 500 MG tablet, Take 1 tablet (500 mg total) by mouth 2 (two) times daily with a meal., Disp: 30 tablet, Rfl: 0   Medications ordered in this encounter:  Meds ordered this encounter  Medications   predniSONE  (STERAPRED UNI-PAK 21 TAB) 10 MG (21) TBPK tablet    Sig: Take following package directions.    Dispense:  21 tablet    Refill:  0    Please dispense one standard blister pack taper.     *If you need refills on other medications prior to your next appointment, please contact your pharmacy*  Follow-Up: Call back or seek an in-person evaluation if the symptoms worsen or if the condition fails to improve as anticipated.  [facility] [phone]  Other Instructions Rash, Adult A rash is a breakout of spots or blotches on the skin. It can affect the way the skin looks and  feels. Many things can cause a rash. Common causes include: Viral infections. These include colds, measles, and hand, foot, and mouth disease. Bacterial infections. These include scarlet fever and impetigo. Fungal infections. These include athlete's foot, ringworm, and yeast rashes. Skin irritation. This may be from heat rash, exposure to moisture or friction for a long time (intertrigo), or exposure to soap or skin care products (eczema). Allergic reactions. These may be caused by foods, medicines, or things like poison ivy. Some rashes may go away after a few days. Others may last for a few weeks. The goal of treatment is to stop the itching and keep the rash from spreading. Follow these instructions at home: Medicine Take or apply over-the-counter and prescription medicines only as told by your health care provider. These may include: Corticosteroids. These can help treat red or swollen skin. They may be given as creams or as medicines to take by mouth (orally). Anti-itch lotions. Allergy medicines. Pain medicine. Antifungal medicine if the rash is from a fungal infection. Antibiotics if you have an infection.  Skin care Apply cool, wet cloths (compresses) to the affected areas. Do not scratch or rub your skin. Avoid covering the rash. Keep it exposed to air as often as you can. Managing itching and discomfort Avoid hot showers and baths. These can make itching worse. A cold shower may help. Try taking a bath with: Epsom salts. You can get these at your local pharmacy or grocery store. Follow the  instructions on the package. Baking soda. Pour a small amount into the bath as told by your provider. Colloidal oatmeal. You can get this at your local pharmacy or grocery store. Follow the instructions on the package. Try putting baking soda paste on your skin. Stir water into baking soda until it becomes like a paste. Try using calamine lotion or cortisone cream to help with itchiness. Keep  cool. Stay out of the sun. Sweating and being hot can make itching worse. General instructions  Rest as needed. Drink enough fluid to keep your pee (urine) pale yellow. Wear loose-fitting clothes. Avoid scented soaps, detergents, and perfumes. Use gentle soaps, detergents, perfumes, and cosmetics. Avoid the things that cause your rash (triggers). Keep a journal to help keep track of your triggers. Write down: What you eat. What cosmetics you use. What you drink. What you wear. This includes jewelry. Contact a health care provider if: You sweat at night more than normal. You pee (urinate) more or less than normal, or your pee is a darker color than normal. Your eyes become sensitive to light. Your skin or the white parts of your eyes turn yellow (jaundice). Your skin tingles or is numb. You get painful blisters in your nose or mouth. Your rash does not go away after a few days, or it gets worse. You are more tired or thirsty than normal. You have new or worse symptoms. These may include: Pain in your abdomen. Fever. Diarrhea or vomiting. Weakness or weight loss. Get help right away if: You get confused. You have a severe headache, a stiff neck, or severe joint pain or stiffness. You become very sleepy or not responsive. You have a seizure. This information is not intended to replace advice given to you by your health care provider. Make sure you discuss any questions you have with your health care provider. Document Revised: 11/25/2021 Document Reviewed: 11/25/2021 Elsevier Patient Education  2024 Elsevier Inc.   If you have been instructed to have an in-person evaluation today at a local Urgent Care facility, please use the link below. It will take you to a list of all of our available [facility], including address, phone number and hours of operation. Please do not delay care.  [facility]  If you or a family member do not have a primary care  provider, use the link below to schedule a visit and establish care. When you choose a [facility] primary care physician or advanced practice provider, you gain a long-term partner in health. Find a Primary Care Provider  Learn more about [facility] in-office and virtual care options: [facility] - Get Care Now

## 2024-03-28 ENCOUNTER — Encounter: Payer: Self-pay | Admitting: Family Medicine

## 2024-03-28 ENCOUNTER — Ambulatory Visit: Admitting: Family Medicine

## 2024-03-28 VITALS — BP 122/76 | HR 70 | Temp 98.0°F | Wt 204.9 lb

## 2024-03-28 DIAGNOSIS — M545 Low back pain, unspecified: Secondary | ICD-10-CM

## 2024-03-28 MED ORDER — CYCLOBENZAPRINE HCL 10 MG PO TABS: 10.0000 mg | ORAL_TABLET | Freq: Every evening | ORAL | 0 refills | Status: AC | PRN

## 2024-03-28 NOTE — Progress Notes (Signed)
 "  Established Patient Office Visit  Subjective   Patient ID: Matthew Mendoza, male    DOB: 24-Nov-1975  Age: 49 y.o. MRN: [id number]  Chief Complaint  Patient presents with   Back Pain    HPI    Eladio is seen with some ongoing back pain.  Was seen last January with acute lumbar pain after pulling on a heavy filing cabinet through work.  He had extensive treatment and physical therapy without much improvement.  Eventually saw orthopedics.  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  He was referred to orthopedic back specialist.  He had functional capacity evaluation recently and was given apparently 5% permanent disability.  They gave him stipulations to avoid lifting over certain amounts.  He has found it difficult to find meaningful employment that can work within this range of restrictions.  He is trying to manage things much as possible with lifestyle modification.  He has been doing some intermittent fasting and has lost some weight.  Unfortunately still smokes some.  Has benefited some from cyclobenzaprine  in the past and requesting refill.  Did have some expected sedation with this and plans to take only at night.  Past Medical History:  Diagnosis Date   Asthma    MRSA carrier    Past Surgical History:  Procedure Laterality Date   I & D EXTREMITY Left 05/01/2018   Procedure: IRRIGATION AND DEBRIDEMENT FINGER;  Surgeon: Shari Easter, MD;  Location: [facility] OR;  Service: Orthopedics;  Laterality: Left;    reports that he has been smoking cigarettes. He has never used smokeless tobacco. He reports that he does not drink alcohol and does not use drugs. family history includes Depression in his mother. Allergies[1]  Review of Systems  Constitutional:  Negative for chills and fever.  Genitourinary:  Negative for dysuria.  Musculoskeletal:  Positive for back pain.   Neurological:  Negative for focal weakness.      Objective:     BP 122/76   Pulse 70   Temp 98 F (36.7 C) (Oral)   Wt 204 lb 14.4 oz (92.9 kg)   SpO2 95%   BMI 29.40 kg/m  BP Readings from Last 3 Encounters:  03/28/24 122/76  04/06/23 (!) 140/84  03/23/23 130/78   Wt Readings from Last 3 Encounters:  03/28/24 204 lb 14.4 oz (92.9 kg)  04/06/23 219 lb 14.4 oz (99.7 kg)  03/23/23 220 lb 9.6 oz (100.1 kg)      Physical Exam Vitals reviewed.  Constitutional:      General: He is not in acute distress.    Appearance: He is not ill-appearing.  Cardiovascular:     Rate and Rhythm: Normal rate and regular rhythm.  Musculoskeletal:     Comments: Straight leg raises are negative.  He has some tenderness to palpation left lower lumbar region.  Neurological:     Mental Status: He is alert.     Comments: Full strength with plantarflexion, dorsiflexion, knee extension.  2+ reflexes ankle and knee bilaterally      No results found for any visits on 03/28/24.    The ASCVD Risk score (Arnett DK, et al., 2019) failed to calculate for the following reasons:   Cannot find a previous HDL lab   Cannot find a previous total cholesterol lab   * - Cholesterol units were assumed    Assessment & Plan:  Persistent mostly left lumbar back pain following injury over a year ago.  He has seen orthopedic back specialist and had recent functional capacity evaluation.  Patient requesting refill of cyclobenzaprine  which has provided some benefit in the past.  We wrote for cyclobenzaprine  10 mg nightly and he is aware this can be sedating.  Strongly encouraged to stop smoking to try to improve overall circulation.  Wolm Scarlet, MD     [1] No Known Allergies  "
